# Patient Record
Sex: Female | Born: 1955 | Race: White | Hispanic: No | Smoking: Current every day smoker
Health system: Southern US, Community
[De-identification: ages and names within clinical notes are randomized; demographics above are authoritative.]

## PROBLEM LIST (undated history)

## (undated) DIAGNOSIS — I10 Essential (primary) hypertension: Secondary | ICD-10-CM

## (undated) DIAGNOSIS — B1920 Unspecified viral hepatitis C without hepatic coma: Secondary | ICD-10-CM

## (undated) DIAGNOSIS — K7460 Unspecified cirrhosis of liver: Secondary | ICD-10-CM

## (undated) DIAGNOSIS — D696 Thrombocytopenia, unspecified: Secondary | ICD-10-CM

## (undated) DIAGNOSIS — F419 Anxiety disorder, unspecified: Secondary | ICD-10-CM

## (undated) DIAGNOSIS — Z720 Tobacco use: Secondary | ICD-10-CM

## (undated) DIAGNOSIS — IMO0001 Reserved for inherently not codable concepts without codable children: Secondary | ICD-10-CM

## (undated) DIAGNOSIS — K922 Gastrointestinal hemorrhage, unspecified: Secondary | ICD-10-CM

## (undated) DIAGNOSIS — D649 Anemia, unspecified: Secondary | ICD-10-CM

## (undated) HISTORY — PX: HUMERUS FRACTURE SURGERY: SHX670

## (undated) HISTORY — PX: SHOULDER SURGERY: SHX246

---

## 2004-01-03 ENCOUNTER — Ambulatory Visit: Payer: Self-pay | Admitting: Orthopedic Surgery

## 2004-02-01 ENCOUNTER — Ambulatory Visit: Payer: Self-pay | Admitting: Orthopedic Surgery

## 2006-05-19 ENCOUNTER — Ambulatory Visit (HOSPITAL_COMMUNITY): Admission: RE | Admit: 2006-05-19 | Discharge: 2006-05-19 | Payer: Self-pay | Admitting: Family Medicine

## 2007-07-27 ENCOUNTER — Ambulatory Visit (HOSPITAL_COMMUNITY): Admission: RE | Admit: 2007-07-27 | Discharge: 2007-07-27 | Payer: Self-pay | Admitting: Internal Medicine

## 2009-01-07 ENCOUNTER — Inpatient Hospital Stay (HOSPITAL_COMMUNITY): Admission: EM | Admit: 2009-01-07 | Discharge: 2009-01-09 | Payer: Self-pay | Admitting: Emergency Medicine

## 2009-01-25 ENCOUNTER — Ambulatory Visit: Payer: Self-pay | Admitting: Gastroenterology

## 2009-02-22 ENCOUNTER — Ambulatory Visit: Payer: Self-pay | Admitting: Gastroenterology

## 2009-05-24 ENCOUNTER — Ambulatory Visit: Payer: Self-pay | Admitting: Gastroenterology

## 2009-08-27 ENCOUNTER — Emergency Department (HOSPITAL_COMMUNITY): Admission: EM | Admit: 2009-08-27 | Discharge: 2009-08-27 | Payer: Self-pay | Admitting: Emergency Medicine

## 2010-01-26 ENCOUNTER — Encounter: Payer: Self-pay | Admitting: Internal Medicine

## 2010-03-22 LAB — CBC
HCT: 30.3 % — ABNORMAL LOW (ref 36.0–46.0)
Hemoglobin: 10.5 g/dL — ABNORMAL LOW (ref 12.0–15.0)
MCH: 34.2 pg — ABNORMAL HIGH (ref 26.0–34.0)
MCV: 98.9 fL (ref 78.0–100.0)
Platelets: 32 10*3/uL — ABNORMAL LOW (ref 150–400)
RBC: 3.06 MIL/uL — ABNORMAL LOW (ref 3.87–5.11)
RDW: 14.8 % (ref 11.5–15.5)
WBC: 3.1 10*3/uL — ABNORMAL LOW (ref 4.0–10.5)

## 2010-03-22 LAB — COMPREHENSIVE METABOLIC PANEL
Alkaline Phosphatase: 57 U/L (ref 39–117)
BUN: 12 mg/dL (ref 6–23)
Chloride: 109 mEq/L (ref 96–112)
Creatinine, Ser: 0.6 mg/dL (ref 0.4–1.2)
GFR calc Af Amer: >60 mL/min (ref >60)
GFR calc non Af Amer: >60 mL/min (ref >60)
Glucose, Bld: 110 mg/dL — ABNORMAL HIGH (ref 70–99)
Potassium: 3.9 mEq/L (ref 3.5–5.1)
Sodium: 136 mEq/L (ref 135–145)
Total Protein: 7 g/dL (ref 6.0–8.3)

## 2010-03-22 LAB — URINE MICROSCOPIC-ADD ON

## 2010-03-22 LAB — DIFFERENTIAL
Basophils Absolute: 0 10*3/uL (ref 0.0–0.1)
Lymphs Abs: 1 10*3/uL (ref 0.7–4.0)
Monocytes Absolute: 0.3 10*3/uL (ref 0.1–1.0)
Neutro Abs: 1.8 10*3/uL (ref 1.7–7.7)
Neutrophils Relative %: 58 % (ref 43–77)

## 2010-03-22 LAB — RAPID URINE DRUG SCREEN, HOSP PERFORMED
Barbiturates: NOT DETECTED
Cocaine: NOT DETECTED
Opiates: NOT DETECTED

## 2010-03-22 LAB — URINALYSIS, ROUTINE W REFLEX MICROSCOPIC
Specific Gravity, Urine: <1.005 — ABNORMAL LOW (ref 1.005–1.030)
Urobilinogen, UA: 0.2 mg/dL (ref 0.0–1.0)
pH: 7 (ref 5.0–8.0)

## 2010-03-22 LAB — POCT CARDIAC MARKERS: CKMB, poc: <1 ng/mL — ABNORMAL LOW (ref 1.0–8.0)

## 2010-03-22 LAB — ACETAMINOPHEN LEVEL: Acetaminophen (Tylenol), Serum: <10 ug/mL — ABNORMAL LOW (ref 10–30)

## 2010-03-22 LAB — LACTIC ACID, PLASMA: Lactic Acid, Venous: 2.8 mmol/L — ABNORMAL HIGH (ref 0.5–2.2)

## 2010-03-24 LAB — DIFFERENTIAL
Basophils Absolute: 0.1 10*3/uL (ref 0.0–0.1)
Basophils Relative: 1 % (ref 0–1)
Eosinophils Relative: 4 % (ref 0–5)
Lymphs Abs: 3.5 10*3/uL (ref 0.7–4.0)
Monocytes Absolute: 0.6 10*3/uL (ref 0.1–1.0)
Monocytes Relative: 12 % (ref 3–12)
Neutro Abs: 1.1 10*3/uL — ABNORMAL LOW (ref 1.7–7.7)

## 2010-03-24 LAB — CARDIAC PANEL(CRET KIN+CKTOT+MB+TROPI)
CK, MB: 2.6 ng/mL (ref 0.3–4.0)
CK, MB: 4 ng/mL (ref 0.3–4.0)
Relative Index: INVALID (ref 0.0–2.5)
Relative Index: INVALID (ref 0.0–2.5)
Total CK: 91 U/L (ref 7–177)
Total CK: 98 U/L (ref 7–177)
Troponin I: 0.03 ng/mL (ref 0.00–0.06)

## 2010-03-24 LAB — CBC
HCT: 35.6 % — ABNORMAL LOW (ref 36.0–46.0)
HCT: 39.2 % (ref 36.0–46.0)
Hemoglobin: 12.5 g/dL (ref 12.0–15.0)
MCHC: 35 g/dL (ref 30.0–36.0)
MCV: 104.5 fL — ABNORMAL HIGH (ref 78.0–100.0)
Platelets: 57 10*3/uL — ABNORMAL LOW (ref 150–400)
RBC: 3.4 MIL/uL — ABNORMAL LOW (ref 3.87–5.11)
RBC: 3.75 MIL/uL — ABNORMAL LOW (ref 3.87–5.11)
RDW: 14.6 % (ref 11.5–15.5)

## 2010-03-24 LAB — MITOCHONDRIAL ANTIBODIES: Mitochondrial M2 Ab, IgG: NEGATIVE

## 2010-03-24 LAB — COMPREHENSIVE METABOLIC PANEL
ALT: 101 U/L — ABNORMAL HIGH (ref 0–35)
AST: 234 U/L — ABNORMAL HIGH (ref 0–37)
Albumin: 2.7 g/dL — ABNORMAL LOW (ref 3.5–5.2)
Alkaline Phosphatase: 76 U/L (ref 39–117)
Alkaline Phosphatase: 78 U/L (ref 39–117)
BUN: 6 mg/dL (ref 6–23)
BUN: 9 mg/dL (ref 6–23)
CO2: 23 mEq/L (ref 19–32)
CO2: 25 mEq/L (ref 19–32)
Chloride: 106 mEq/L (ref 96–112)
Chloride: 108 mEq/L (ref 96–112)
GFR calc non Af Amer: >60 mL/min (ref >60)
Glucose, Bld: 101 mg/dL — ABNORMAL HIGH (ref 70–99)
Glucose, Bld: 95 mg/dL (ref 70–99)
Potassium: 3.7 mEq/L (ref 3.5–5.1)
Potassium: 4 mEq/L (ref 3.5–5.1)
Sodium: 140 mEq/L (ref 135–145)
Total Bilirubin: 1.4 mg/dL — ABNORMAL HIGH (ref 0.3–1.2)
Total Bilirubin: 2.6 mg/dL — ABNORMAL HIGH (ref 0.3–1.2)
Total Protein: 6.6 g/dL (ref 6.0–8.3)
Total Protein: 7.3 g/dL (ref 6.0–8.3)

## 2010-03-24 LAB — ANTI-SMOOTH MUSCLE ANTIBODY, IGG: F-Actin IgG: <20 U (ref <20)

## 2010-03-24 LAB — POCT CARDIAC MARKERS
Myoglobin, poc: 153 ng/mL (ref 12–200)
Troponin i, poc: <0.05 ng/mL (ref 0.00–0.09)

## 2010-03-24 LAB — TRANSFERRIN: Transferrin: 173 mg/dL — ABNORMAL LOW (ref 212–360)

## 2010-03-24 LAB — FERRITIN: Ferritin: 812 ng/mL — ABNORMAL HIGH (ref 10–291)

## 2010-03-24 LAB — LIPASE, BLOOD
Lipase: 156 U/L — ABNORMAL HIGH (ref 11–59)
Lipase: 50 U/L (ref 11–59)
Lipase: 61 U/L — ABNORMAL HIGH (ref 11–59)

## 2010-03-24 LAB — D-DIMER, QUANTITATIVE: D-Dimer, Quant: 0.35 ug/mL-FEU (ref 0.00–0.48)

## 2010-03-24 LAB — ANA: Anti Nuclear Antibody(ANA): NEGATIVE

## 2010-03-24 LAB — T4, FREE: Free T4: 0.82 ng/dL (ref 0.80–1.80)

## 2010-03-24 LAB — IRON AND TIBC

## 2010-03-24 LAB — HEPATITIS PANEL, ACUTE: HCV Ab: REACTIVE — AB

## 2010-03-24 LAB — LIPID PANEL
Cholesterol: 125 mg/dL (ref 0–200)
LDL Cholesterol: 69 mg/dL (ref 0–99)

## 2010-06-04 ENCOUNTER — Emergency Department (HOSPITAL_COMMUNITY): Payer: BC Managed Care – PPO

## 2010-06-04 ENCOUNTER — Emergency Department (HOSPITAL_COMMUNITY)
Admission: EM | Admit: 2010-06-04 | Discharge: 2010-06-04 | Disposition: A | Discharge status: Home / Self Care | Payer: BC Managed Care – PPO | Attending: Emergency Medicine | Admitting: Emergency Medicine

## 2010-06-04 DIAGNOSIS — K7460 Unspecified cirrhosis of liver: Secondary | ICD-10-CM | POA: Insufficient documentation

## 2010-06-04 DIAGNOSIS — R42 Dizziness and giddiness: Secondary | ICD-10-CM | POA: Insufficient documentation

## 2010-06-04 DIAGNOSIS — R0602 Shortness of breath: Secondary | ICD-10-CM | POA: Insufficient documentation

## 2010-06-04 DIAGNOSIS — I10 Essential (primary) hypertension: Secondary | ICD-10-CM | POA: Insufficient documentation

## 2010-06-04 DIAGNOSIS — F17200 Nicotine dependence, unspecified, uncomplicated: Secondary | ICD-10-CM | POA: Insufficient documentation

## 2010-06-04 DIAGNOSIS — B1920 Unspecified viral hepatitis C without hepatic coma: Secondary | ICD-10-CM | POA: Insufficient documentation

## 2010-06-04 LAB — TROPONIN I: Troponin I: <0.3 ng/mL (ref <0.30)

## 2010-06-04 LAB — DIFFERENTIAL
Basophils Absolute: 0.1 10*3/uL (ref 0.0–0.1)
Basophils Relative: 2 % — ABNORMAL HIGH (ref 0–1)
Eosinophils Absolute: 0.2 10*3/uL (ref 0.0–0.7)
Monocytes Relative: 11 % (ref 3–12)
Neutro Abs: 1 10*3/uL — ABNORMAL LOW (ref 1.7–7.7)
Neutrophils Relative %: 35 % — ABNORMAL LOW (ref 43–77)

## 2010-06-04 LAB — AMMONIA: Ammonia: 26 umol/L (ref 11–60)

## 2010-06-04 LAB — CBC
Hemoglobin: 12.4 g/dL (ref 12.0–15.0)
Platelets: 72 10*3/uL — ABNORMAL LOW (ref 150–400)
RBC: 3.82 MIL/uL — ABNORMAL LOW (ref 3.87–5.11)
WBC: 2.9 10*3/uL — ABNORMAL LOW (ref 4.0–10.5)

## 2010-06-04 LAB — BASIC METABOLIC PANEL
BUN: 10 mg/dL (ref 6–23)
Chloride: 104 mEq/L (ref 96–112)
GFR calc Af Amer: >60 mL/min (ref >60)
GFR calc non Af Amer: >60 mL/min (ref >60)
Potassium: 3.7 mEq/L (ref 3.5–5.1)

## 2010-06-04 LAB — CK TOTAL AND CKMB (NOT AT ARMC): CK, MB: 1.7 ng/mL (ref 0.3–4.0)

## 2011-01-09 IMAGING — CT CT ABD-PELV W/ CM
1 of 3 series · 14 of 32 positions shown, 19 images · IV contrast (APPLIED)
Comparison: None

CLINICAL DATA: Nausea and elevated lipase.

CT ABDOMEN AND PELVIS WITH CONTRAST
TECHNIQUE: Multidetector CT imaging of the abdomen and pelvis was
performed following the standard protocol during bolus
administration of intravenous contrast.
Contrast: 80 ml of 6mnipaque-4YY

[Series 2: abd/pelv with 5.0 b31f st · axial · 0.75mm/px · z∈[+800,+1196]mm · 14 of 89 slices shown, 19 images]
[im 5/89  soft-tissue]
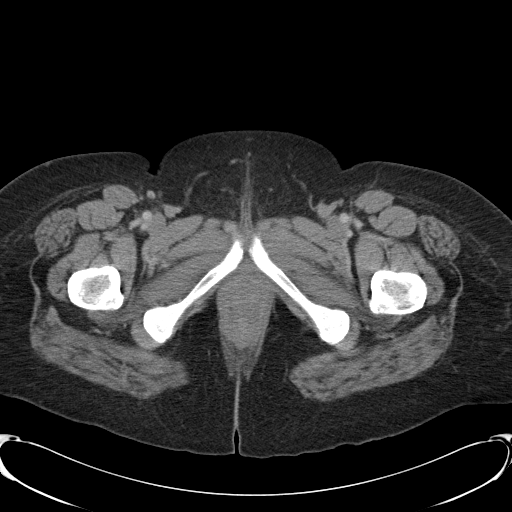
[im 5/89  bone]
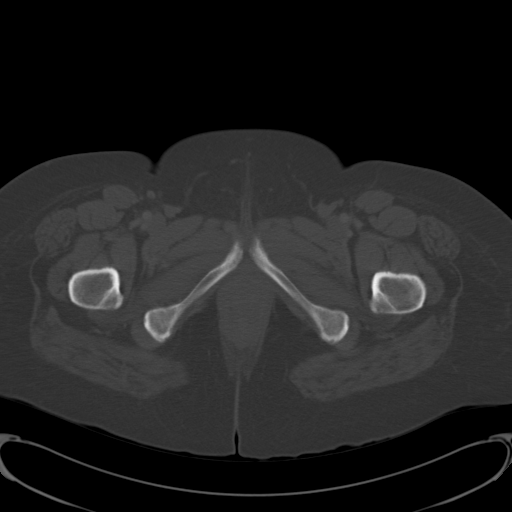
[im 14/89  soft-tissue]
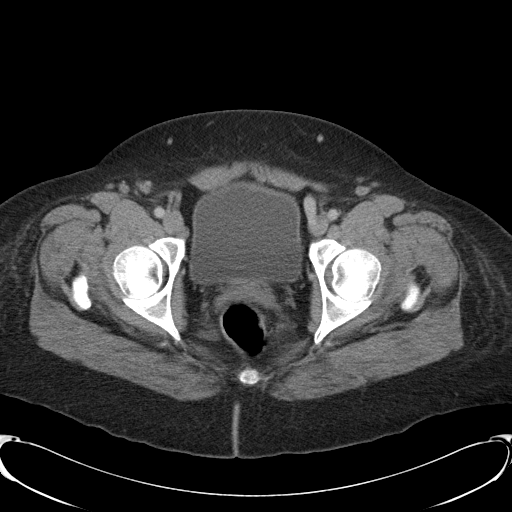
[im 18/89  soft-tissue]
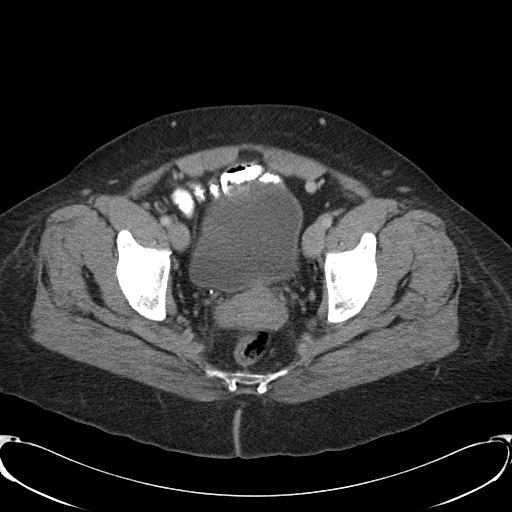
[im 27/89  soft-tissue]
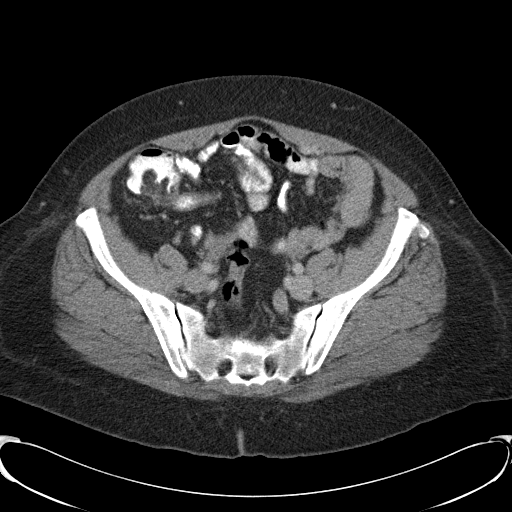
[im 31/89  soft-tissue]
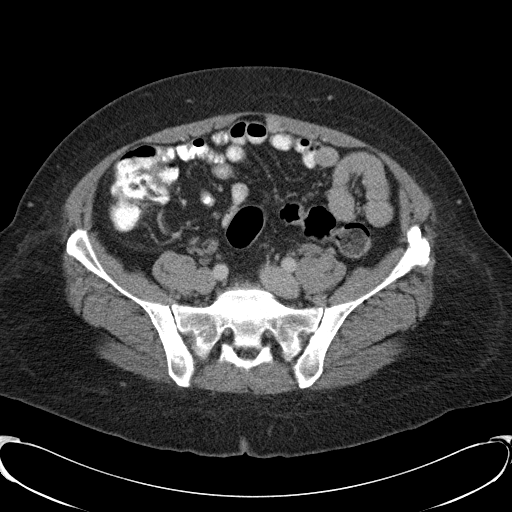
[im 40/89  soft-tissue]
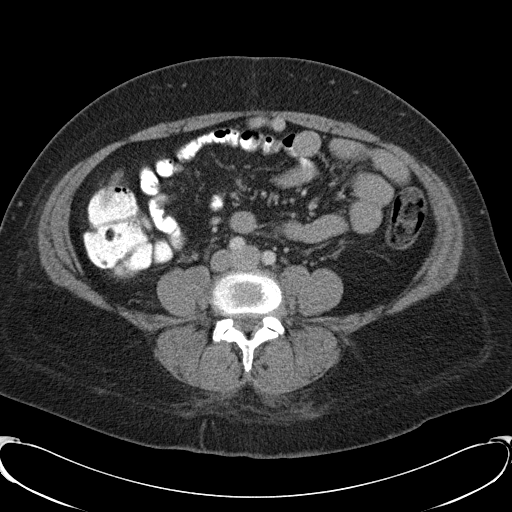
[im 45/89  soft-tissue]
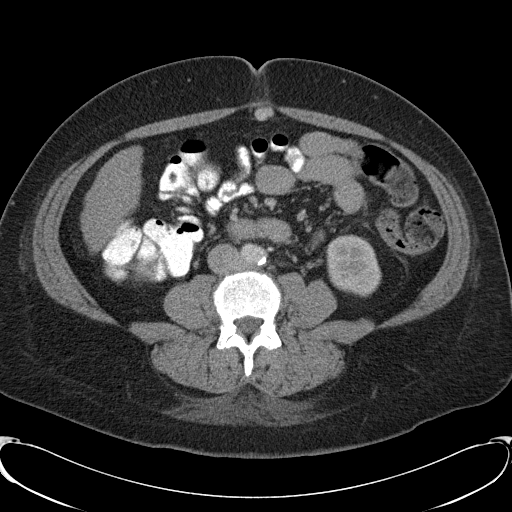
[im 49/89  soft-tissue]
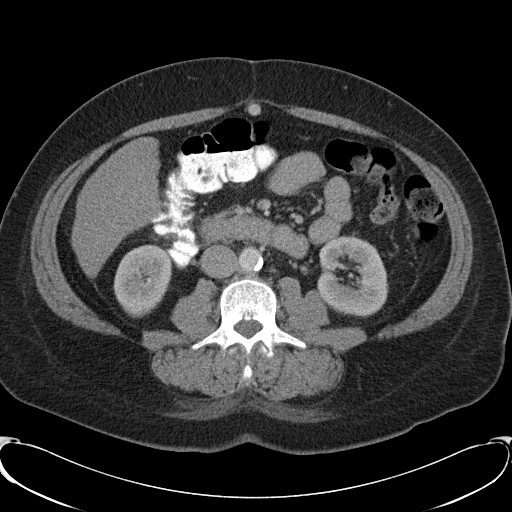
[im 58/89  soft-tissue]
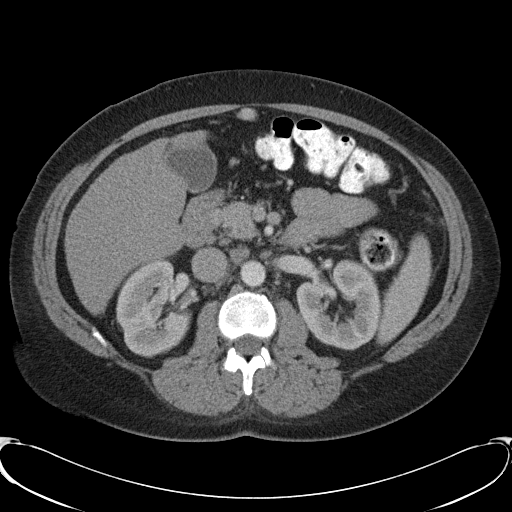
[im 58/89  bone]
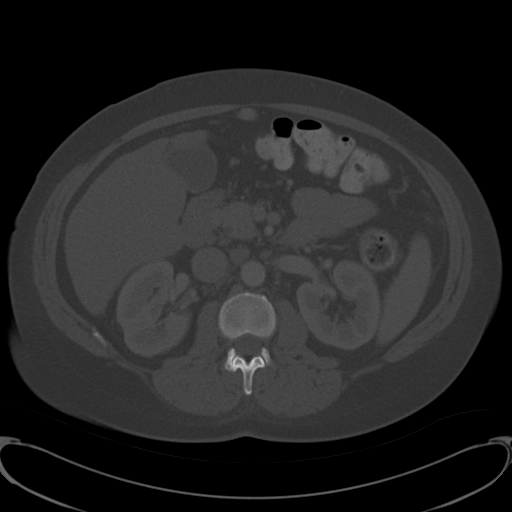
[im 62/89  soft-tissue]
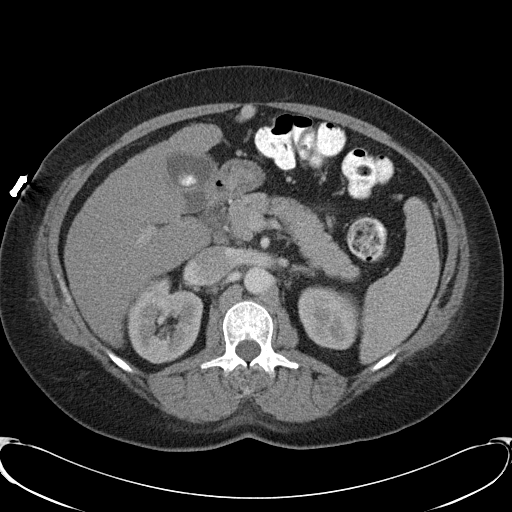
[im 71/89  soft-tissue]
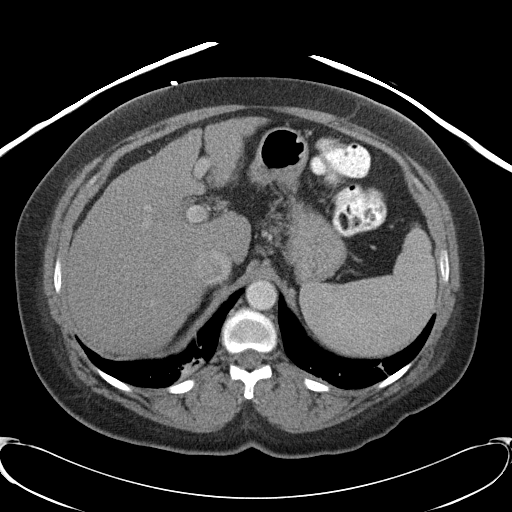
[im 71/89  lung]
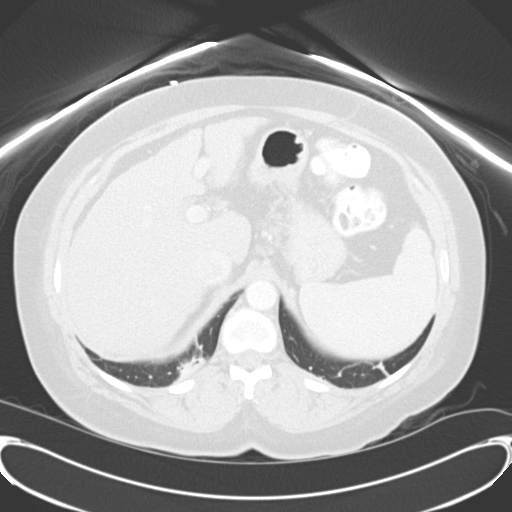
[im 75/89  soft-tissue]
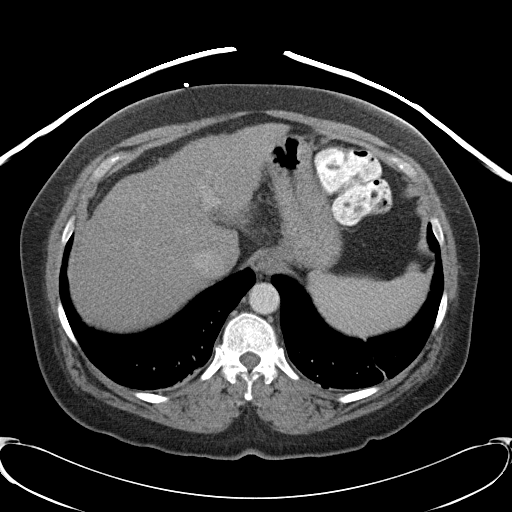
[im 75/89  lung]
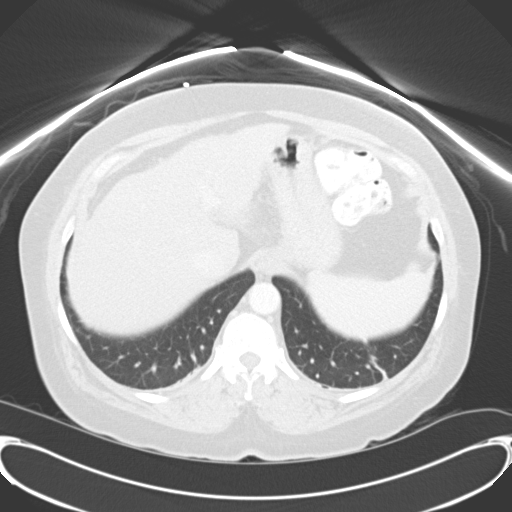
[im 80/89  lung]
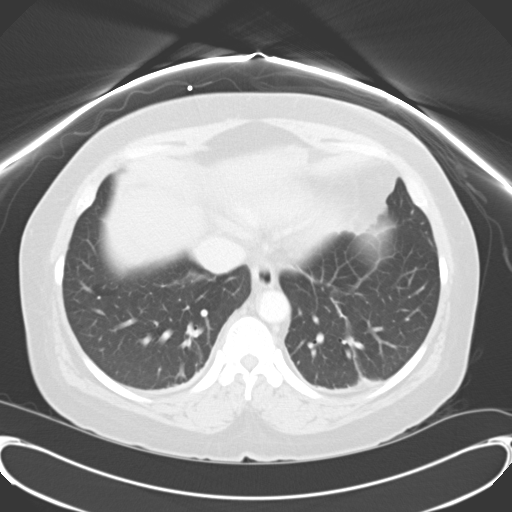
[im 84/89  soft-tissue]
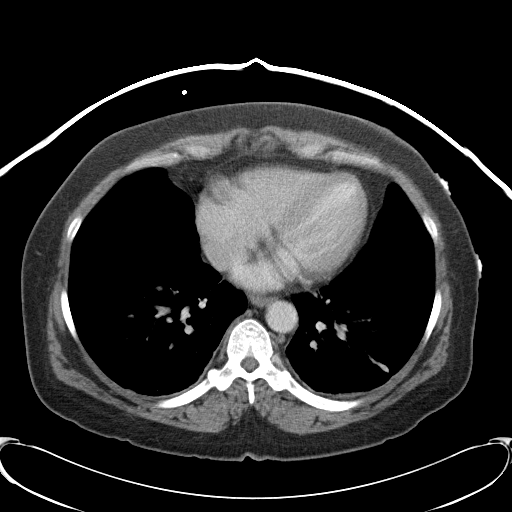
[im 84/89  lung]
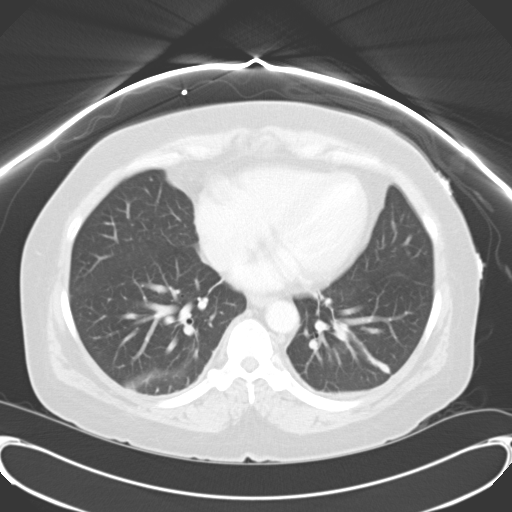

[14 of 32 positions shown; findings below may reference images not displayed]

FINDINGS: The liver has slightly year irregular and nodular
consistent with cirrhosis.  The patient has an large collateral
vein extending from the portal vein in the epigastric region
inferiorly along the left inferior epigastric artery to left
inserts into the left common femoral vein.  This indicates that the
patient has portal hypertension and has decompressed the portal
venous system through this large collateral vessel.

The the patient has no evidence of esophageal varices or varices
adjacent to the splenic artery and vein.

The spleen, pancreas, adrenal glands, and kidneys appear normal.

There is a 2.4 cm calcified stone in the nondistended gallbladder.
Bile ducts are not dilated.

No significant bony abnormality.

The uterus and ovaries appear normal.  Terminal ileum and appendix
are normal.
IMPRESSION: 1.  Cirrhosis.
2.  Portal hypertension.
3.  The portal venous system has spontaneously decompressed through
a varix extending from the porta hepatis inferiorly in the midline
deep to  the anterior abdominal wall into the left common femoral
vein.

## 2011-03-17 ENCOUNTER — Emergency Department (HOSPITAL_COMMUNITY)
Admission: EM | Admit: 2011-03-17 | Discharge: 2011-03-17 | Disposition: A | Discharge status: Home / Self Care | Payer: BC Managed Care – PPO | Attending: Emergency Medicine | Admitting: Emergency Medicine

## 2011-03-17 ENCOUNTER — Encounter (HOSPITAL_COMMUNITY): Payer: Self-pay | Admitting: *Deleted

## 2011-03-17 DIAGNOSIS — K829 Disease of gallbladder, unspecified: Secondary | ICD-10-CM | POA: Insufficient documentation

## 2011-03-17 DIAGNOSIS — R1011 Right upper quadrant pain: Secondary | ICD-10-CM | POA: Insufficient documentation

## 2011-03-17 DIAGNOSIS — F17200 Nicotine dependence, unspecified, uncomplicated: Secondary | ICD-10-CM | POA: Insufficient documentation

## 2011-03-17 DIAGNOSIS — K7460 Unspecified cirrhosis of liver: Secondary | ICD-10-CM | POA: Insufficient documentation

## 2011-03-17 DIAGNOSIS — I10 Essential (primary) hypertension: Secondary | ICD-10-CM | POA: Insufficient documentation

## 2011-03-17 HISTORY — DX: Unspecified cirrhosis of liver: K74.60

## 2011-03-17 HISTORY — DX: Essential (primary) hypertension: I10

## 2011-03-17 LAB — CBC
Hemoglobin: 14.1 g/dL (ref 12.0–15.0)
Platelets: 85 10*3/uL — ABNORMAL LOW (ref 150–400)
RBC: 4.17 MIL/uL (ref 3.87–5.11)
WBC: 4.9 10*3/uL (ref 4.0–10.5)

## 2011-03-17 LAB — COMPREHENSIVE METABOLIC PANEL
ALT: 47 U/L — ABNORMAL HIGH (ref 0–35)
Alkaline Phosphatase: 88 U/L (ref 39–117)
BUN: 10 mg/dL (ref 6–23)
CO2: 31 mEq/L (ref 19–32)
Chloride: 101 mEq/L (ref 96–112)
GFR calc Af Amer: >90 mL/min (ref >90)
GFR calc non Af Amer: >90 mL/min (ref >90)
Glucose, Bld: 81 mg/dL (ref 70–99)
Potassium: 3.7 mEq/L (ref 3.5–5.1)
Sodium: 139 mEq/L (ref 135–145)
Total Bilirubin: 1.1 mg/dL (ref 0.3–1.2)

## 2011-03-17 LAB — URINALYSIS, ROUTINE W REFLEX MICROSCOPIC
Bilirubin Urine: NEGATIVE
Ketones, ur: NEGATIVE mg/dL
Nitrite: NEGATIVE
Protein, ur: NEGATIVE mg/dL
Urobilinogen, UA: 0.2 mg/dL (ref 0.0–1.0)
pH: 6.5 (ref 5.0–8.0)

## 2011-03-17 LAB — DIFFERENTIAL
Lymphocytes Relative: 44 % (ref 12–46)
Lymphs Abs: 2.1 10*3/uL (ref 0.7–4.0)
Monocytes Relative: 10 % (ref 3–12)
Neutrophils Relative %: 42 % — ABNORMAL LOW (ref 43–77)

## 2011-03-17 LAB — URINE MICROSCOPIC-ADD ON

## 2011-03-17 MED ORDER — ONDANSETRON HCL 4 MG/2ML IJ SOLN
4.0000 mg | Freq: Once | INTRAMUSCULAR | Status: AC
  Administered 2011-03-17: 4 mg via INTRAVENOUS
  Filled 2011-03-17: qty 2

## 2011-03-17 MED ORDER — HYDROCODONE-ACETAMINOPHEN 5-325 MG PO TABS: 1.0000 | ORAL_TABLET | ORAL | Status: AC | PRN

## 2011-03-17 MED ORDER — ONDANSETRON HCL 4 MG PO TABS: 4.0000 mg | ORAL_TABLET | Freq: Four times a day (QID) | ORAL | Status: AC

## 2011-03-17 MED ORDER — HYDROMORPHONE HCL PF 1 MG/ML IJ SOLN
1.0000 mg | Freq: Once | INTRAMUSCULAR | Status: AC
  Administered 2011-03-17: 1 mg via INTRAVENOUS
  Filled 2011-03-17: qty 1

## 2011-03-17 MED ORDER — SODIUM CHLORIDE 0.9 % IV BOLUS (SEPSIS)
1000.0000 mL | Freq: Once | INTRAVENOUS | Status: AC
  Administered 2011-03-17: 1000 mL via INTRAVENOUS

## 2011-03-17 NOTE — ED Provider Notes (Signed)
History     CSN: [id number]  Arrival date & time 03/17/11  1428   First MD Initiated Contact with Patient 03/17/11 1734      Chief Complaint  Patient presents with  . Abdominal Pain    (Consider location/radiation/quality/duration/timing/severity/associated sxs/prior treatment) HPI Cynthia Mathews is a 56 y.o. female with a history of cirrhosis who presents to the Emergency Department complaining of RUQ pain associated with nausea, no vomiting. Denies fever, chills, diarrhea, chest pain, shortness of breath. She was seen in her primary care physician's office today and sent to the emergency room for evaluation. She has taken no medicines.  PCP Dr. Sherwood Gambler   Past Medical History  Diagnosis Date  . Cirrhosis   . Hypertension     Past Surgical History  Procedure Date  . Shoulder surgery     History reviewed. No pertinent family history.  History  Substance Use Topics  . Smoking status: Current Everyday Smoker  . Smokeless tobacco: Not on file  . Alcohol Use: No    OB History    Grav Para Term Preterm Abortions TAB SAB Ect Mult Living                  Review of Systems A 10 review of systems reviewed and are negative for acute change except as noted in the HPI. Allergies  Review of patient's allergies indicates no known allergies.  Home Medications  No current outpatient prescriptions on file.  BP 125/62  Pulse 83  Temp(Src) 98.1 F (36.7 C) (Oral)  Resp 16  Ht 5\' 1"  (1.549 m)  Wt 151 lb (68.493 kg)  BMI 28.53 kg/m2  SpO2 97%  Physical Exam Physical examination:  Nursing notes reviewed; Vital signs and O2 SAT reviewed;  Constitutional: Well developed, Well nourished, Well hydrated, In no acute distress; Head:  Normocephalic, atraumatic; Eyes: EOMI, PERRL, No scleral icterus; ENMT: Mouth and pharynx normal, Mucous membranes moist; Neck: Supple, Full range of motion, No lymphadenopathy; Cardiovascular: Regular rate and rhythm, No murmur, rub, or gallop;  Respiratory: Breath sounds clear & equal bilaterally, No rales, rhonchi, wheezes, or rub, Normal respiratory effort/excursion; Chest: Nontender, Movement normal; Abdomen: Soft, mild right upper quadrant tenderness without Murphy's sign, no rebound, no guarding, Nondistended, Normal bowel sounds; Genitourinary: No CVA tenderness; Extremities: Pulses normal, No tenderness, No edema, No calf edema or asymmetry.; Neuro: AA&Ox3, Major CN grossly intact.  No gross focal motor or sensory deficits in extremities.; Skin: Color normal, Warm, Dry  ED Course  Procedures (including critical care time) Results for orders placed during the hospital encounter of 03/17/11  CBC      Component Value Range   WBC 4.9  4.0 - 10.5 (K/uL)   RBC 4.17  3.87 - 5.11 (MIL/uL)   Hemoglobin 14.1  12.0 - 15.0 (g/dL)   HCT 40.9  81.1 - 91.4 (%)   MCV 95.4  78.0 - 100.0 (fL)   MCH 33.8  26.0 - 34.0 (pg)   MCHC 35.4  30.0 - 36.0 (g/dL)   RDW 78.2  95.6 - 21.3 (%)   Platelets 85 (*) 150 - 400 (K/uL)  DIFFERENTIAL      Component Value Range   Neutrophils Relative 42 (*) 43 - 77 (%)   Neutro Abs 2.1  1.7 - 7.7 (K/uL)   Lymphocytes Relative 44  12 - 46 (%)   Lymphs Abs 2.1  0.7 - 4.0 (K/uL)   Monocytes Relative 10  3 - 12 (%)   Monocytes Absolute  0.5  0.1 - 1.0 (K/uL)   Eosinophils Relative 4  0 - 5 (%)   Eosinophils Absolute 0.2  0.0 - 0.7 (K/uL)   Basophils Relative 1  0 - 1 (%)   Basophils Absolute 0.0  0.0 - 0.1 (K/uL)  COMPREHENSIVE METABOLIC PANEL      Component Value Range   Sodium 139  135 - 145 (mEq/L)   Potassium 3.7  3.5 - 5.1 (mEq/L)   Chloride 101  96 - 112 (mEq/L)   CO2 31  19 - 32 (mEq/L)   Glucose, Bld 81  70 - 99 (mg/dL)   BUN 10  6 - 23 (mg/dL)   Creatinine, Ser 1.61  0.50 - 1.10 (mg/dL)   Calcium 9.3  8.4 - 09.6 (mg/dL)   Total Protein 7.6  6.0 - 8.3 (g/dL)   Albumin 3.6  3.5 - 5.2 (g/dL)   AST 57 (*) 0 - 37 (U/L)   ALT 47 (*) 0 - 35 (U/L)   Alkaline Phosphatase 88  39 - 117 (U/L)   Total  Bilirubin 1.1  0.3 - 1.2 (mg/dL)   GFR calc non Af Amer >90  >90 (mL/min)   GFR calc Af Amer >90  >90 (mL/min)  URINALYSIS, ROUTINE W REFLEX MICROSCOPIC      Component Value Range   Color, Urine YELLOW  YELLOW    APPearance CLEAR  CLEAR    Specific Gravity, Urine <1.005 (*) 1.005 - 1.030    pH 6.5  5.0 - 8.0    Glucose, UA NEGATIVE  NEGATIVE (mg/dL)   Hgb urine dipstick NEGATIVE  NEGATIVE    Bilirubin Urine NEGATIVE  NEGATIVE    Ketones, ur NEGATIVE  NEGATIVE (mg/dL)   Protein, ur NEGATIVE  NEGATIVE (mg/dL)   Urobilinogen, UA 0.2  0.0 - 1.0 (mg/dL)   Nitrite NEGATIVE  NEGATIVE    Leukocytes, UA SMALL (*) NEGATIVE   LIPASE, BLOOD      Component Value Range   Lipase 55  11 - 59 (U/L)  URINE MICROSCOPIC-ADD ON      Component Value Range   Squamous Epithelial / LPF FEW (*) RARE    WBC, UA 3-6  <3 (WBC/hpf)   RBC / HPF 0-2  <3 (RBC/hpf)   Bacteria, UA FEW (*) RARE        MDM  Patient with h/o cirrhosis here with RUQ pain. Seen by PCP who felt she might need an [place] to r/o gall bladder disease.Pt stable in ED with no significant deterioration in condition.The patient appears reasonably screened and/or stabilized for discharge and I doubt any other medical condition or other [facility] requiring further screening, evaluation, or treatment in the ED at this time prior to discharge.  MDM Reviewed: nursing note and vitals Interpretation: labs           Nicoletta Dress. Colon Branch, MD 03/19/11 1810

## 2011-03-17 NOTE — Discharge Instructions (Signed)
Your blood work here tonight was normal.  Nothing to eat or drink after 11:30PM . You are scheduled for an ultrasound at 7:30 AM. Be in radiology at 7:15 AM. Once your ultrasound is done, you will come to the ER waiting room and the doctor on duty will review your results with you.

## 2011-03-17 NOTE — ED Notes (Signed)
Onset this am RUQ pain, nausea, no vomiting, Hx of cirrhosis

## 2011-03-18 ENCOUNTER — Ambulatory Visit (HOSPITAL_COMMUNITY)
Admit: 2011-03-18 | Discharge: 2011-03-18 | Disposition: A | Discharge status: Home / Self Care | Payer: BC Managed Care – PPO | Source: Ambulatory Visit | Attending: Emergency Medicine | Admitting: Emergency Medicine

## 2011-03-18 DIAGNOSIS — R112 Nausea with vomiting, unspecified: Secondary | ICD-10-CM | POA: Insufficient documentation

## 2011-03-18 DIAGNOSIS — R1011 Right upper quadrant pain: Secondary | ICD-10-CM | POA: Insufficient documentation

## 2011-03-18 DIAGNOSIS — K829 Disease of gallbladder, unspecified: Secondary | ICD-10-CM

## 2011-03-18 DIAGNOSIS — K7460 Unspecified cirrhosis of liver: Secondary | ICD-10-CM | POA: Insufficient documentation

## 2011-03-18 DIAGNOSIS — K8020 Calculus of gallbladder without cholecystitis without obstruction: Secondary | ICD-10-CM | POA: Insufficient documentation

## 2011-03-18 NOTE — ED Provider Notes (Signed)
Pt returned today for RUQ ultrasound. Large mobile gallstone without evidence of cholecystitis. Pt reports currently symptom free. Surgery referral provided.  Raeford Razor, MD 03/18/11 [phone]

## 2011-03-19 ENCOUNTER — Telehealth: Payer: Self-pay | Admitting: Gastroenterology

## 2011-03-19 NOTE — Telephone Encounter (Signed)
Ms Cynthia Mathews called to relate that she had been in the [facility] ER for RUQ pain and nausea on 03/17/11.  An ultrasound on 03/18/11 was done and I reviewed it.  There is no evidence of cholecystitis.  Given the lack of objective evidence of cholecystitis and her operative risk due to cirrhosis, I called and advised against a cholecystectomy.  She accepted my advice.  Ms Cynthia Mathews will see me in May 2013 at [facility].

## 2011-06-29 ENCOUNTER — Encounter (HOSPITAL_COMMUNITY): Payer: Self-pay | Admitting: *Deleted

## 2011-06-29 ENCOUNTER — Emergency Department (HOSPITAL_COMMUNITY)
Admission: EM | Admit: 2011-06-29 | Discharge: 2011-06-29 | Disposition: A | Discharge status: Home / Self Care | Payer: BC Managed Care – PPO | Attending: Emergency Medicine | Admitting: Emergency Medicine

## 2011-06-29 ENCOUNTER — Emergency Department (HOSPITAL_COMMUNITY): Payer: BC Managed Care – PPO

## 2011-06-29 DIAGNOSIS — W010XXA Fall on same level from slipping, tripping and stumbling without subsequent striking against object, initial encounter: Secondary | ICD-10-CM | POA: Insufficient documentation

## 2011-06-29 DIAGNOSIS — Z8781 Personal history of (healed) traumatic fracture: Secondary | ICD-10-CM | POA: Insufficient documentation

## 2011-06-29 DIAGNOSIS — Y92009 Unspecified place in unspecified non-institutional (private) residence as the place of occurrence of the external cause: Secondary | ICD-10-CM | POA: Insufficient documentation

## 2011-06-29 DIAGNOSIS — I10 Essential (primary) hypertension: Secondary | ICD-10-CM | POA: Insufficient documentation

## 2011-06-29 DIAGNOSIS — S4980XA Other specified injuries of shoulder and upper arm, unspecified arm, initial encounter: Secondary | ICD-10-CM | POA: Insufficient documentation

## 2011-06-29 DIAGNOSIS — S4992XA Unspecified injury of left shoulder and upper arm, initial encounter: Secondary | ICD-10-CM

## 2011-06-29 DIAGNOSIS — K7460 Unspecified cirrhosis of liver: Secondary | ICD-10-CM | POA: Insufficient documentation

## 2011-06-29 DIAGNOSIS — M25519 Pain in unspecified shoulder: Secondary | ICD-10-CM | POA: Insufficient documentation

## 2011-06-29 DIAGNOSIS — S46909A Unspecified injury of unspecified muscle, fascia and tendon at shoulder and upper arm level, unspecified arm, initial encounter: Secondary | ICD-10-CM | POA: Insufficient documentation

## 2011-06-29 DIAGNOSIS — Z885 Allergy status to narcotic agent status: Secondary | ICD-10-CM | POA: Insufficient documentation

## 2011-06-29 DIAGNOSIS — Z79899 Other long term (current) drug therapy: Secondary | ICD-10-CM | POA: Insufficient documentation

## 2011-06-29 MED ORDER — HYDROCODONE-ACETAMINOPHEN 5-325 MG PO TABS
2.0000 | ORAL_TABLET | Freq: Once | ORAL | Status: AC
  Administered 2011-06-29: 2 via ORAL
  Filled 2011-06-29: qty 2

## 2011-06-29 MED ORDER — HYDROCODONE-ACETAMINOPHEN 5-325 MG PO TABS: ORAL_TABLET | ORAL | Status: AC

## 2011-06-29 NOTE — ED Notes (Signed)
Pt transported to xray 

## 2011-06-29 NOTE — ED Notes (Signed)
Reports having fall today, landed on left shoulder and now having severe pain, hx of surgery to that shoulder.  +left radial.

## 2011-06-29 NOTE — Progress Notes (Signed)
Orthopedic Tech Progress Note Patient Details:  Cynthia Mathews Oct 22, 1955 [id number]  Ortho Devices Type of Ortho Device: Arm foam sling Ortho Device/Splint Location: left UE Ortho Device/Splint Interventions: Application   [name] T 06/29/2011, 1:07 PM

## 2011-06-29 NOTE — ED Provider Notes (Signed)
History  This chart was scribed for [name], MD by Bennett Scrape. This patient was seen in room TR06C/TR06C and the patient's care was started at 12:26PM.  CSN: [id number]  Arrival date & time 06/29/11  1206   First MD Initiated Contact with Patient 06/29/11 1226      Chief Complaint  Patient presents with  . Fall  . Shoulder Injury    The history is provided by the patient. No language interpreter was used.    SAMMANTHA Mathews is a 56 y.o. female who presents to the Emergency Department complaining of a slip and fall in which she landed on her left shoulder approximately one hour PTA. Pt states that she was walking down her front yard to get the newspaper when she slipped on the wet grass in her flip flops and landed on her left shoulder. She denies LOC or head trauma. She rates her pain a 15 out of 10 currently. The pain is worse with movement of the left shoulder. She denies taking OTC medications at home to improve symptoms. She denies having any other injuries currently. She denies fever, emesis and nausea as associated symptoms. She has a h/o left shoulder surgery, cirrhosis and HTN. She is a current everyday smoker but denies alcohol use.   Past Medical History  Diagnosis Date  . Cirrhosis   . Hypertension     Past Surgical History  Procedure Date  . Shoulder surgery- Left, done at [facility]     History reviewed. No pertinent family history.  History  Substance Use Topics  . Smoking status: Current Everyday Smoker  . Smokeless tobacco: Not on file  . Alcohol Use: No     Review of Systems  Constitutional: Negative for chills.  Musculoskeletal:       Left shoulder pain  Skin: Negative for wound.  All other systems reviewed and are negative.    Allergies  Codeine-feels nauseated   Home Medications   Current Outpatient Rx  Name Route Sig Dispense Refill  . ACETAMINOPHEN 500 MG PO TABS Oral Take 500 mg by mouth every 6 (six) hours as needed. For pain.    Marland Kitchen  CITALOPRAM HYDROBROMIDE 20 MG PO TABS Oral Take 20 mg by mouth at bedtime.    . FUROSEMIDE 40 MG PO TABS Oral Take 40 mg by mouth daily.    Marland Kitchen LACTULOSE 10 GM/15ML PO SOLN Oral Take 30 g by mouth 4 (four) times daily as needed. For constipation.    Marland Kitchen LORAZEPAM 0.5 MG PO TABS Oral Take 0.5 mg by mouth every 8 (eight) hours as needed. For anxiety.    . ADULT MULTIVITAMIN W/MINERALS CH Oral Take 1 tablet by mouth daily.    Marland Kitchen RIFAXIMIN 550 MG PO TABS Oral Take 550 mg by mouth 2 (two) times daily.    Marland Kitchen SPIRONOLACTONE 25 MG PO TABS Oral Take 25 mg by mouth daily.    Marland Kitchen VITAMIN B-1 PO Oral Take 1 tablet by mouth daily.      Triage Vitals: BP 128/71  Pulse 102  Temp 98.1 F (36.7 C) (Oral)  Resp 18  SpO2 99%  Physical Exam  Nursing note and vitals reviewed.  GEN: Well-developed, well-nourished female in no distress HEENT: Atraumatic, normocephalic. Oropharynx clear without erythema EYES: PERRLA BL, no scleral icterus. NECK: Trachea midline, no meningismus CV: regular rate and rhythm. No murmurs, rubs, or gallops PULM: No respiratory distress.  No crackles, wheezes, or rales. GI: soft, non-tender. No guarding, rebound, or tenderness. +  bowel sounds  GU: deferred Neuro: cranial nerves grossly 2-12 intact, no abnormalities of strength or sensation, A and O x 3 MSK: Surgical scar over the left shoulder that goes into the left upper arm, no tenderness to palpation over scapula, slight tenderness over the shoulder anteriorly, no deformity,  ROM is limited secondary to pain. Otherwise, Patient moves all 4 extremities symmetrically, no deformity, edema, or injury noted Skin: No rashes petechiae, purpura, or jaundice Psych: no abnormality of mood   ED Course  Procedures (including critical care time)  DIAGNOSTIC STUDIES: Oxygen Saturation is 99% on room air, normal by my interpretation.    COORDINATION OF CARE: 12:47PM-Discussed discharge plan which includes sling, ice pack and Vicodin with pt  and pt agreed to plan. Advised pt to follow up with Dr. Ophelia Charter if pain persists.  Labs Reviewed - No data to display Dg Shoulder Left  06/29/2011  *RADIOLOGY REPORT*  Clinical Data: Fall  LEFT SHOULDER - 2+ VIEW  Comparison: None.  Findings: Multiple plates and screws are  present transfixing a healed proximal humerus fracture with deformity.  No acute fracture.  No breakage or loosening of the hardware.  IMPRESSION: Status post ORIF left proximal humerus fracture with healed deformity.  No acute bony pathology.  Original Report Authenticated By: Donavan Burnet, M.D.     1. Injury of left shoulder       MDM  Patient was evaluated by myself. She did not have significant deformity. Plain film was performed and this was negative. Patient did continue to have tenderness to palpation but no dislocation. Following plain films patient was significantly reassured. Given her tenderness she was placed in a sling and given an ice back. She does not take anti-inflammatories secondary to her psoriasis and recommendations by her doctor. She was given a prescription for Vicodin and was feeling better prior to discharge. She was told she can follow up with orthopedics if her pain persists after swelling decreases from this very acute injury. Patient was discharged in good condition and comfortable with plan.  I personally performed the services described in this documentation, which was scribed in my presence. The recorded information has been reviewed and considered.          [name], MD 06/29/11 2035

## 2011-07-17 ENCOUNTER — Other Ambulatory Visit: Payer: Self-pay | Admitting: Orthopedic Surgery

## 2011-07-17 DIAGNOSIS — IMO0002 Reserved for concepts with insufficient information to code with codable children: Secondary | ICD-10-CM

## 2011-07-18 ENCOUNTER — Ambulatory Visit
Admission: RE | Admit: 2011-07-18 | Discharge: 2011-07-18 | Disposition: A | Discharge status: Home / Self Care | Payer: BC Managed Care – PPO | Source: Ambulatory Visit | Attending: Orthopedic Surgery | Admitting: Orthopedic Surgery

## 2011-07-18 DIAGNOSIS — IMO0002 Reserved for concepts with insufficient information to code with codable children: Secondary | ICD-10-CM

## 2011-08-06 ENCOUNTER — Other Ambulatory Visit: Payer: Self-pay | Admitting: Gastroenterology

## 2011-08-06 DIAGNOSIS — K7460 Unspecified cirrhosis of liver: Secondary | ICD-10-CM

## 2011-08-06 DIAGNOSIS — B182 Chronic viral hepatitis C: Secondary | ICD-10-CM

## 2011-08-08 ENCOUNTER — Ambulatory Visit (HOSPITAL_COMMUNITY)
Admission: RE | Admit: 2011-08-08 | Discharge: 2011-08-08 | Disposition: A | Discharge status: Home / Self Care | Payer: BC Managed Care – PPO | Source: Ambulatory Visit | Attending: Gastroenterology | Admitting: Gastroenterology

## 2011-08-08 DIAGNOSIS — K8020 Calculus of gallbladder without cholecystitis without obstruction: Secondary | ICD-10-CM | POA: Insufficient documentation

## 2011-08-08 DIAGNOSIS — K7460 Unspecified cirrhosis of liver: Secondary | ICD-10-CM

## 2011-08-08 DIAGNOSIS — B182 Chronic viral hepatitis C: Secondary | ICD-10-CM

## 2012-06-14 ENCOUNTER — Encounter (HOSPITAL_COMMUNITY): Payer: Self-pay | Admitting: *Deleted

## 2012-06-14 ENCOUNTER — Other Ambulatory Visit: Payer: Self-pay

## 2012-06-14 ENCOUNTER — Emergency Department (HOSPITAL_COMMUNITY)
Admission: EM | Admit: 2012-06-14 | Discharge: 2012-06-14 | Disposition: A | Discharge status: Home / Self Care | Payer: BC Managed Care – PPO | Attending: Emergency Medicine | Admitting: Emergency Medicine

## 2012-06-14 DIAGNOSIS — R5381 Other malaise: Secondary | ICD-10-CM | POA: Insufficient documentation

## 2012-06-14 DIAGNOSIS — R5383 Other fatigue: Secondary | ICD-10-CM | POA: Insufficient documentation

## 2012-06-14 DIAGNOSIS — R42 Dizziness and giddiness: Secondary | ICD-10-CM | POA: Insufficient documentation

## 2012-06-14 DIAGNOSIS — F17200 Nicotine dependence, unspecified, uncomplicated: Secondary | ICD-10-CM | POA: Insufficient documentation

## 2012-06-14 DIAGNOSIS — E7220 Disorder of urea cycle metabolism, unspecified: Secondary | ICD-10-CM | POA: Insufficient documentation

## 2012-06-14 DIAGNOSIS — E876 Hypokalemia: Secondary | ICD-10-CM | POA: Insufficient documentation

## 2012-06-14 DIAGNOSIS — I10 Essential (primary) hypertension: Secondary | ICD-10-CM | POA: Insufficient documentation

## 2012-06-14 DIAGNOSIS — Z79899 Other long term (current) drug therapy: Secondary | ICD-10-CM | POA: Insufficient documentation

## 2012-06-14 DIAGNOSIS — K7460 Unspecified cirrhosis of liver: Secondary | ICD-10-CM

## 2012-06-14 DIAGNOSIS — F29 Unspecified psychosis not due to a substance or known physiological condition: Secondary | ICD-10-CM | POA: Insufficient documentation

## 2012-06-14 LAB — COMPREHENSIVE METABOLIC PANEL
ALT: 57 U/L — ABNORMAL HIGH (ref 0–35)
BUN: 6 mg/dL (ref 6–23)
CO2: 29 mEq/L (ref 19–32)
Calcium: 8.8 mg/dL (ref 8.4–10.5)
GFR calc Af Amer: >90 mL/min (ref >90)
GFR calc non Af Amer: >90 mL/min (ref >90)
Glucose, Bld: 111 mg/dL — ABNORMAL HIGH (ref 70–99)
Total Protein: 8 g/dL (ref 6.0–8.3)

## 2012-06-14 LAB — URINALYSIS, ROUTINE W REFLEX MICROSCOPIC
Bilirubin Urine: NEGATIVE
Nitrite: NEGATIVE
Specific Gravity, Urine: <1.005 — ABNORMAL LOW (ref 1.005–1.030)
Urobilinogen, UA: 0.2 mg/dL (ref 0.0–1.0)
pH: 5.5 (ref 5.0–8.0)

## 2012-06-14 LAB — CBC WITH DIFFERENTIAL/PLATELET
Basophils Absolute: 0.1 10*3/uL (ref 0.0–0.1)
Basophils Relative: 1 % (ref 0–1)
HCT: 41.9 % (ref 36.0–46.0)
Hemoglobin: 15.5 g/dL — ABNORMAL HIGH (ref 12.0–15.0)
Lymphocytes Relative: 51 % — ABNORMAL HIGH (ref 12–46)
MCHC: 37 g/dL — ABNORMAL HIGH (ref 30.0–36.0)
Monocytes Absolute: 0.6 10*3/uL (ref 0.1–1.0)
Monocytes Relative: 9 % (ref 3–12)
Neutro Abs: 2.3 10*3/uL (ref 1.7–7.7)
Neutrophils Relative %: 35 % — ABNORMAL LOW (ref 43–77)
RDW: 13.8 % (ref 11.5–15.5)
WBC: 6.6 10*3/uL (ref 4.0–10.5)

## 2012-06-14 LAB — GLUCOSE, CAPILLARY: Glucose-Capillary: 114 mg/dL — ABNORMAL HIGH (ref 70–99)

## 2012-06-14 LAB — ETHANOL: Alcohol, Ethyl (B): 267 mg/dL — ABNORMAL HIGH (ref 0–11)

## 2012-06-14 LAB — AMMONIA: Ammonia: 38 umol/L (ref 11–60)

## 2012-06-14 LAB — RAPID URINE DRUG SCREEN, HOSP PERFORMED: Opiates: NOT DETECTED

## 2012-06-14 MED ORDER — POTASSIUM CHLORIDE CRYS ER 20 MEQ PO TBCR
40.0000 meq | EXTENDED_RELEASE_TABLET | Freq: Once | ORAL | Status: AC
  Administered 2012-06-14: 40 meq via ORAL
  Filled 2012-06-14: qty 2

## 2012-06-14 NOTE — ED Provider Notes (Signed)
History     CSN: [id number]  Arrival date & time 06/14/12  0545   First MD Initiated Contact with Patient 06/14/12 [phone]      Chief Complaint  Patient presents with  . Altered Mental Status     Patient is a 57 y.o. female presenting with altered mental status. The history is provided by the patient and a significant other.  Altered Mental Status Presenting symptoms: confusion and disorientation   Severity:  Mild Most recent episode:  Today Episode history:  Single Duration: unknown. Timing:  Constant Progression:  Worsening Context comment:  Cirrhosis Associated symptoms: light-headedness and weakness   Associated symptoms: no abdominal pain, no difficulty breathing, no fever, no headaches, no visual change and no vomiting   Patient reports she woke up this morning and felt "disoriented" and also reports "dizziness" She denies fall No HA No visual changes No cp/sob.  No abd pain.  No fever is reported   She feels generally weak but no focal weakness   Patient has h/o cirrhosis with hyperammonemia in the past Her and her significant other have recently separated, and she called him tonight and he reports that he could immediately tell she was altered. He reports this is similar to prior episodes of hyperammonemia  Past Medical History  Diagnosis Date  . Cirrhosis   . Hypertension     Past Surgical History  Procedure Laterality Date  . Shoulder surgery      History reviewed. No pertinent family history.  History  Substance Use Topics  . Smoking status: Current Every Day Smoker  . Smokeless tobacco: Not on file  . Alcohol Use: No    OB History   Grav Para Term Preterm Abortions TAB SAB Ect Mult Living                  Review of Systems  Constitutional: Negative for fever.  Eyes: Negative for visual disturbance.  Respiratory: Negative for shortness of breath.   Cardiovascular: Negative for chest pain.  Gastrointestinal: Negative for vomiting and abdominal  pain.  Neurological: Positive for weakness and light-headedness. Negative for headaches.  Psychiatric/Behavioral: Positive for confusion and altered mental status.  All other systems reviewed and are negative.    Allergies  Codeine  Home Medications   Current Outpatient Rx  Name  Route  Sig  Dispense  Refill  . acetaminophen (TYLENOL) 500 MG tablet   Oral   Take 500 mg by mouth every 6 (six) hours as needed. For pain.         . citalopram (CELEXA) 20 MG tablet   Oral   Take 20 mg by mouth at bedtime.         . furosemide (LASIX) 40 MG tablet   Oral   Take 40 mg by mouth daily.         Marland Kitchen HYDROcodone-acetaminophen (NORCO) 5-325 MG per tablet      Take 1-2 tabs by mouth every 6 hours when necessary pain.   20 tablet   0   . lactulose (CHRONULAC) 10 GM/15ML solution   Oral   Take 30 g by mouth 4 (four) times daily as needed. For constipation.         Marland Kitchen LORazepam (ATIVAN) 0.5 MG tablet   Oral   Take 0.5 mg by mouth every 8 (eight) hours as needed. For anxiety.         . Multiple Vitamin (MULTIVITAMIN WITH MINERALS) TABS   Oral   Take 1 tablet  by mouth daily.         . rifaximin (XIFAXAN) 550 MG TABS   Oral   Take 550 mg by mouth 2 (two) times daily.         Marland Kitchen spironolactone (ALDACTONE) 25 MG tablet   Oral   Take 25 mg by mouth daily.         . Thiamine HCl (VITAMIN B-1 PO)   Oral   Take 1 tablet by mouth daily.           BP 125/71  Pulse 88  Temp(Src) 98 F (36.7 C) (Oral)  Resp 18  Ht 5\' 1"  (1.549 m)  Wt 186 lb (84.369 kg)  BMI 35.16 kg/m2  SpO2 94%  Physical Exam CONSTITUTIONAL: Well developed/well nourished HEAD: Normocephalic/atraumatic EYES: EOMI/PERRL, no nystagmus ENMT: Mucous membranes moist NECK: supple no meningeal signs SPINE:entire spine nontender CV: S1/S2 noted, no murmurs/rubs/gallops noted LUNGS: Lungs are clear to auscultation bilaterally, no apparent distress ABDOMEN: soft, nontender, no rebound or  guarding GU:no cva tenderness NEURO:Awake/alert, facies symmetric, no arm or leg drift is noted Upon ambulation, her gait is unsteady but she is able to correct and walk on her own No past pointing She is able to answer all questions appropriately EXTREMITIES: pulses normal, full ROM SKIN: warm, color normal PSYCH: no abnormalities of mood noted   ED Course  Procedures  Labs Reviewed  COMPREHENSIVE METABOLIC PANEL  CBC WITH DIFFERENTIAL  URINALYSIS, ROUTINE W REFLEX MICROSCOPIC  AMMONIA  ETHANOL  URINE RAPID DRUG SCREEN (HOSP PERFORMED)   6:16 AM Pt presents with reported confusion.  She is awake/alert, but does appear easily distracted.  She answers questions appropriately, though her significant other reports she kept arguing about today's date on the way to hospital.   She appears mildly unsteady on her feet but does not appear to have any signs of acute CVA.  She denies missed doses of her lactulose  Will initiate lab evaluation for confusion.  It is reported this is very similar to prior episodes of hyperammonemia, will start with labs.  Defer any neuro imaging for now 7:13 AM Pt stable, no new complaints Ammonia level appropriate, however ETOH is elevated Pt denies any ETOH use Her symptoms could be explained by alcohol use I doubt acute CVA or other acute neurologic/metabolic process.  She is well appearing and stable for d/c home Do not feel she is in acute delirium  MDM  Nursing notes including past medical history and social history reviewed and considered in documentation Previous records reviewed and considered - h/o cirrhosis        Date: 06/14/2012  Rate: 90  Rhythm: normal sinus rhythm  QRS Axis: normal  Intervals: QT prolonged  ST/T Wave abnormalities: nonspecific ST changes  Conduction Disutrbances:none  Narrative Interpretation:   Old EKG Reviewed: qt is more prolonged on today's ekg but less than 500    Joya Gaskins, MD 06/14/12 0715

## 2012-06-14 NOTE — ED Notes (Signed)
Ambulated to bathroom without assistance, and does not want help to ambulate, gait slightly unsteady at times, and obtained urine specimen.  Friend who is with her states when she called him - she did not sound normal - and told him she did not feel right.  He states she is acting the way she did the last time when her ammonia level was too high.

## 2012-06-14 NOTE — ED Notes (Signed)
Pt reports she woke up this morning and was very disoriented.  States this has happened previously due to elevated ammonia levels.  States last time was about 1 year ago.

## 2012-07-08 ENCOUNTER — Other Ambulatory Visit: Payer: Self-pay | Admitting: Gastroenterology

## 2013-04-20 ENCOUNTER — Other Ambulatory Visit (HOSPITAL_COMMUNITY): Payer: Self-pay | Admitting: Physician Assistant

## 2013-04-20 DIAGNOSIS — Z139 Encounter for screening, unspecified: Secondary | ICD-10-CM

## 2013-04-27 ENCOUNTER — Ambulatory Visit (HOSPITAL_COMMUNITY)
Admission: RE | Admit: 2013-04-27 | Discharge: 2013-04-27 | Disposition: A | Discharge status: Home / Self Care | Payer: BC Managed Care – PPO | Source: Ambulatory Visit | Attending: Physician Assistant | Admitting: Physician Assistant

## 2013-04-27 DIAGNOSIS — Z139 Encounter for screening, unspecified: Secondary | ICD-10-CM | POA: Insufficient documentation

## 2013-11-16 ENCOUNTER — Inpatient Hospital Stay (HOSPITAL_COMMUNITY)
Admission: EM | Admit: 2013-11-16 | Discharge: 2013-11-23 | DRG: 392 | Disposition: A | Discharge status: Home / Self Care | Payer: BC Managed Care – PPO | Attending: Internal Medicine | Admitting: Internal Medicine

## 2013-11-16 ENCOUNTER — Encounter (HOSPITAL_COMMUNITY): Payer: Self-pay

## 2013-11-16 ENCOUNTER — Emergency Department (HOSPITAL_COMMUNITY): Payer: Self-pay

## 2013-11-16 DIAGNOSIS — Z23 Encounter for immunization: Secondary | ICD-10-CM

## 2013-11-16 DIAGNOSIS — E869 Volume depletion, unspecified: Secondary | ICD-10-CM | POA: Diagnosis present

## 2013-11-16 DIAGNOSIS — K766 Portal hypertension: Secondary | ICD-10-CM | POA: Diagnosis present

## 2013-11-16 DIAGNOSIS — D696 Thrombocytopenia, unspecified: Secondary | ICD-10-CM | POA: Diagnosis present

## 2013-11-16 DIAGNOSIS — B1920 Unspecified viral hepatitis C without hepatic coma: Secondary | ICD-10-CM | POA: Diagnosis present

## 2013-11-16 DIAGNOSIS — K2970 Gastritis, unspecified, without bleeding: Secondary | ICD-10-CM | POA: Diagnosis present

## 2013-11-16 DIAGNOSIS — I4891 Unspecified atrial fibrillation: Secondary | ICD-10-CM | POA: Diagnosis present

## 2013-11-16 DIAGNOSIS — D689 Coagulation defect, unspecified: Secondary | ICD-10-CM | POA: Diagnosis present

## 2013-11-16 DIAGNOSIS — I10 Essential (primary) hypertension: Secondary | ICD-10-CM | POA: Diagnosis present

## 2013-11-16 DIAGNOSIS — D709 Neutropenia, unspecified: Secondary | ICD-10-CM | POA: Diagnosis present

## 2013-11-16 DIAGNOSIS — K5900 Constipation, unspecified: Secondary | ICD-10-CM | POA: Diagnosis present

## 2013-11-16 DIAGNOSIS — F419 Anxiety disorder, unspecified: Secondary | ICD-10-CM | POA: Diagnosis present

## 2013-11-16 DIAGNOSIS — D62 Acute posthemorrhagic anemia: Secondary | ICD-10-CM | POA: Diagnosis present

## 2013-11-16 DIAGNOSIS — R0602 Shortness of breath: Secondary | ICD-10-CM

## 2013-11-16 DIAGNOSIS — B182 Chronic viral hepatitis C: Secondary | ICD-10-CM | POA: Diagnosis present

## 2013-11-16 DIAGNOSIS — K7460 Unspecified cirrhosis of liver: Secondary | ICD-10-CM | POA: Diagnosis present

## 2013-11-16 DIAGNOSIS — Z720 Tobacco use: Secondary | ICD-10-CM | POA: Diagnosis present

## 2013-11-16 DIAGNOSIS — Z79899 Other long term (current) drug therapy: Secondary | ICD-10-CM

## 2013-11-16 DIAGNOSIS — R000 Tachycardia, unspecified: Secondary | ICD-10-CM | POA: Diagnosis present

## 2013-11-16 DIAGNOSIS — K3189 Other diseases of stomach and duodenum: Principal | ICD-10-CM | POA: Diagnosis present

## 2013-11-16 DIAGNOSIS — E876 Hypokalemia: Secondary | ICD-10-CM | POA: Diagnosis not present

## 2013-11-16 DIAGNOSIS — Z885 Allergy status to narcotic agent status: Secondary | ICD-10-CM

## 2013-11-16 DIAGNOSIS — K920 Hematemesis: Secondary | ICD-10-CM | POA: Diagnosis present

## 2013-11-16 DIAGNOSIS — F17210 Nicotine dependence, cigarettes, uncomplicated: Secondary | ICD-10-CM | POA: Diagnosis present

## 2013-11-16 DIAGNOSIS — D6959 Other secondary thrombocytopenia: Secondary | ICD-10-CM | POA: Diagnosis present

## 2013-11-16 DIAGNOSIS — K922 Gastrointestinal hemorrhage, unspecified: Secondary | ICD-10-CM | POA: Diagnosis present

## 2013-11-16 HISTORY — DX: Anxiety disorder, unspecified: F41.9

## 2013-11-16 HISTORY — DX: Unspecified viral hepatitis C without hepatic coma: B19.20

## 2013-11-16 HISTORY — DX: Tobacco use: Z72.0

## 2013-11-16 LAB — BASIC METABOLIC PANEL
Anion gap: 20 — ABNORMAL HIGH (ref 5–15)
BUN: 5 mg/dL — AB (ref 6–23)
CALCIUM: 8.2 mg/dL — AB (ref 8.4–10.5)
CO2: 18 mEq/L — ABNORMAL LOW (ref 19–32)
Chloride: 101 mEq/L (ref 96–112)
Creatinine, Ser: 0.48 mg/dL — ABNORMAL LOW (ref 0.50–1.10)
Glucose, Bld: 96 mg/dL (ref 70–99)
POTASSIUM: 3.7 meq/L (ref 3.7–5.3)
SODIUM: 139 meq/L (ref 137–147)

## 2013-11-16 LAB — AMMONIA: Ammonia: 45 umol/L (ref 11–60)

## 2013-11-16 LAB — HEPATIC FUNCTION PANEL
ALT: 44 U/L — ABNORMAL HIGH (ref 0–35)
AST: 157 U/L — ABNORMAL HIGH (ref 0–37)
Albumin: 2.5 g/dL — ABNORMAL LOW (ref 3.5–5.2)
Alkaline Phosphatase: 105 U/L (ref 39–117)
BILIRUBIN DIRECT: 1.6 mg/dL — AB (ref 0.0–0.3)
BILIRUBIN INDIRECT: 2.3 mg/dL — AB (ref 0.3–0.9)
Total Bilirubin: 3.9 mg/dL — ABNORMAL HIGH (ref 0.3–1.2)
Total Protein: 8 g/dL (ref 6.0–8.3)

## 2013-11-16 LAB — CBC WITH DIFFERENTIAL/PLATELET
BASOS PCT: 1 % (ref 0–1)
Basophils Absolute: 0 10*3/uL (ref 0.0–0.1)
EOS ABS: 0 10*3/uL (ref 0.0–0.7)
Eosinophils Relative: 0 % (ref 0–5)
HCT: 35.3 % — ABNORMAL LOW (ref 36.0–46.0)
Hemoglobin: 12.6 g/dL (ref 12.0–15.0)
Lymphocytes Relative: 31 % (ref 12–46)
Lymphs Abs: 0.9 10*3/uL (ref 0.7–4.0)
MCH: 37.1 pg — AB (ref 26.0–34.0)
MCHC: 35.7 g/dL (ref 30.0–36.0)
MCV: 103.8 fL — ABNORMAL HIGH (ref 78.0–100.0)
Monocytes Absolute: 0.3 10*3/uL (ref 0.1–1.0)
Monocytes Relative: 10 % (ref 3–12)
NEUTROS PCT: 57 % (ref 43–77)
Neutro Abs: 1.6 10*3/uL — ABNORMAL LOW (ref 1.7–7.7)
PLATELETS: 43 10*3/uL — AB (ref 150–400)
RBC: 3.4 MIL/uL — ABNORMAL LOW (ref 3.87–5.11)
RDW: 15.2 % (ref 11.5–15.5)
WBC: 2.8 10*3/uL — ABNORMAL LOW (ref 4.0–10.5)

## 2013-11-16 LAB — ETHANOL: Alcohol, Ethyl (B): <11 mg/dL (ref 0–11)

## 2013-11-16 LAB — TYPE AND SCREEN
ABO/RH(D): A POS
ANTIBODY SCREEN: NEGATIVE

## 2013-11-16 LAB — PROTIME-INR
INR: 1.71 — AB (ref 0.00–1.49)
Prothrombin Time: 20.2 seconds — ABNORMAL HIGH (ref 11.6–15.2)

## 2013-11-16 MED ORDER — SODIUM CHLORIDE 0.9 % IV BOLUS (SEPSIS)
500.0000 mL | Freq: Once | INTRAVENOUS | Status: AC
  Administered 2013-11-16: 500 mL via INTRAVENOUS

## 2013-11-16 MED ORDER — SPIRONOLACTONE 25 MG PO TABS
25.0000 mg | ORAL_TABLET | Freq: Every day | ORAL | Status: DC
  Administered 2013-11-17: 25 mg via ORAL
  Filled 2013-11-16: qty 1

## 2013-11-16 MED ORDER — CITALOPRAM HYDROBROMIDE 20 MG PO TABS: 20.0000 mg | ORAL_TABLET | Freq: Every day | ORAL | Status: DC

## 2013-11-16 MED ORDER — OCTREOTIDE ACETATE 500 MCG/ML IJ SOLN
INTRAMUSCULAR | Status: AC
  Filled 2013-11-16: qty 1

## 2013-11-16 MED ORDER — AMLODIPINE BESYLATE 5 MG PO TABS
10.0000 mg | ORAL_TABLET | Freq: Every day | ORAL | Status: DC
  Administered 2013-11-17: 10 mg via ORAL
  Filled 2013-11-16: qty 2

## 2013-11-16 MED ORDER — NICOTINE 14 MG/24HR TD PT24
14.0000 mg | MEDICATED_PATCH | Freq: Every day | TRANSDERMAL | Status: DC
  Filled 2013-11-16 (×7): qty 1

## 2013-11-16 MED ORDER — LORAZEPAM 2 MG/ML IJ SOLN
0.5000 mg | Freq: Three times a day (TID) | INTRAMUSCULAR | Status: DC | PRN
  Administered 2013-11-16 – 2013-11-17 (×2): 1 mg via INTRAVENOUS
  Filled 2013-11-16 (×2): qty 1

## 2013-11-16 MED ORDER — LACTULOSE 10 GM/15ML PO SOLN
30.0000 g | Freq: Two times a day (BID) | ORAL | Status: DC
  Administered 2013-11-17: 30 g via ORAL
  Filled 2013-11-16: qty 60

## 2013-11-16 MED ORDER — LEVOFLOXACIN IN D5W 500 MG/100ML IV SOLN
500.0000 mg | Freq: Once | INTRAVENOUS | Status: AC
  Administered 2013-11-16: 500 mg via INTRAVENOUS
  Filled 2013-11-16: qty 100

## 2013-11-16 MED ORDER — ONDANSETRON HCL 4 MG/2ML IJ SOLN
INTRAMUSCULAR | Status: AC
  Filled 2013-11-16: qty 2

## 2013-11-16 MED ORDER — ONDANSETRON HCL 4 MG/2ML IJ SOLN
4.0000 mg | Freq: Once | INTRAMUSCULAR | Status: AC
  Administered 2013-11-16: 4 mg via INTRAVENOUS

## 2013-11-16 MED ORDER — CETYLPYRIDINIUM CHLORIDE 0.05 % MT LIQD
7.0000 mL | Freq: Two times a day (BID) | OROMUCOSAL | Status: DC
  Administered 2013-11-16 – 2013-11-22 (×13): 7 mL via OROMUCOSAL

## 2013-11-16 MED ORDER — INFLUENZA VAC SPLIT QUAD 0.5 ML IM SUSY
0.5000 mL | PREFILLED_SYRINGE | INTRAMUSCULAR | Status: AC
Start: 2013-11-17 — End: 2013-11-17
  Administered 2013-11-17: 0.5 mL via INTRAMUSCULAR
  Filled 2013-11-16: qty 0.5

## 2013-11-16 MED ORDER — LABETALOL HCL 100 MG PO TABS
50.0000 mg | ORAL_TABLET | Freq: Two times a day (BID) | ORAL | Status: DC
  Administered 2013-11-16: 50 mg via ORAL
  Filled 2013-11-16 (×4): qty 0.5

## 2013-11-16 MED ORDER — PANTOPRAZOLE SODIUM 40 MG IV SOLR
40.0000 mg | Freq: Once | INTRAVENOUS | Status: AC
  Administered 2013-11-16: 40 mg via INTRAVENOUS
  Filled 2013-11-16: qty 40

## 2013-11-16 MED ORDER — SODIUM CHLORIDE 0.45 % IV SOLN
INTRAVENOUS | Status: DC
  Administered 2013-11-16: 1000 mL via INTRAVENOUS

## 2013-11-16 MED ORDER — FUROSEMIDE 10 MG/ML IJ SOLN
20.0000 mg | Freq: Every day | INTRAMUSCULAR | Status: DC
  Administered 2013-11-17: 20 mg via INTRAVENOUS
  Filled 2013-11-16: qty 2

## 2013-11-16 MED ORDER — OCTREOTIDE ACETATE 50 MCG/ML IJ SOLN
25.0000 ug | Freq: Once | INTRAMUSCULAR | Status: AC
  Administered 2013-11-16: 25 ug via INTRAVENOUS
  Filled 2013-11-16: qty 0.5

## 2013-11-16 MED ORDER — OCTREOTIDE ACETATE 100 MCG/ML IJ SOLN
INTRAMUSCULAR | Status: AC
  Filled 2013-11-16: qty 1

## 2013-11-16 MED ORDER — SODIUM CHLORIDE 0.9 % IV SOLN
25.0000 ug/h | INTRAVENOUS | Status: DC
  Administered 2013-11-16 – 2013-11-18 (×2): 25 ug/h via INTRAVENOUS
  Filled 2013-11-16 (×7): qty 1

## 2013-11-16 MED ORDER — SODIUM CHLORIDE 0.9 % IV BOLUS (SEPSIS)
1000.0000 mL | Freq: Once | INTRAVENOUS | Status: AC
  Administered 2013-11-16: 1000 mL via INTRAVENOUS

## 2013-11-16 MED ORDER — ONDANSETRON HCL 4 MG/2ML IJ SOLN
4.0000 mg | Freq: Once | INTRAMUSCULAR | Status: AC
  Administered 2013-11-16: 4 mg via INTRAVENOUS
  Filled 2013-11-16: qty 2

## 2013-11-16 MED ORDER — RIFAXIMIN 550 MG PO TABS
550.0000 mg | ORAL_TABLET | Freq: Two times a day (BID) | ORAL | Status: DC
  Administered 2013-11-17: 550 mg via ORAL
  Filled 2013-11-16: qty 1

## 2013-11-16 NOTE — ED Provider Notes (Signed)
CSN: [id number]     Arrival date & time 11/16/13  1820 History   This chart was scribe for Benny LennertJoseph L Mylo Choi, MD by Angelene GiovanniEmmanuella Mensah, ED Scribe. The patient was seen in room APA06/APA06 and the patient's care was started at 6:56 PM.    Chief Complaint  Patient presents with  . Shortness of Breath   Patient is a 58 y.o. female presenting with shortness of breath. The history is provided by the patient (Pt c/o SOB). No language interpreter was used.  Shortness of Breath Duration:  1 day Timing:  Intermittent Chronicity:  Recurrent Associated symptoms: vomiting   Associated symptoms: no abdominal pain, no chest pain, no cough, no headaches and no rash    HPI Comments: Cynthia Mathews is a 58 y.o. female with a hx of Hepatitis C and cirrhosis who presents to the Emergency Department complaining of SOB with nausea and hematemesis onset yesterday. She reports associated 6 episodes of hematemesis since this morning and 1 episode last night. She denies black stool or pain. She adds that the episodes today have been worse because of the amount of episodes in such a short time. She reports that it has been occurring several times a month for about 6 months. The latest episode was a week ago. She states that [facility] because she has had these emesis episodes before but no concluding results. She reports taking an extra lactulose this morning due to being confused last night which has seen resolved. She states that she is not on the national list for a liver transplant.   PCP: Dr. Sherwood GamblerFusco  [facility]  Past Medical History  Diagnosis Date  . Cirrhosis   . Hypertension   . Anxiety    Past Surgical History  Procedure Laterality Date  . Shoulder surgery    . Humerus fracture surgery     No family history on file. History  Substance Use Topics  . Smoking status: Current Every Day Smoker  . Smokeless tobacco: Not on file  . Alcohol Use: No   OB History     No data available     Review of Systems  Constitutional: Negative for appetite change and fatigue.  HENT: Negative for congestion, ear discharge and sinus pressure.   Eyes: Negative for discharge.  Respiratory: Positive for shortness of breath. Negative for cough.   Cardiovascular: Negative for chest pain.  Gastrointestinal: Positive for nausea and vomiting. Negative for abdominal pain, diarrhea and blood in stool.  Genitourinary: Negative for frequency and hematuria.  Musculoskeletal: Negative for back pain.  Skin: Negative for rash.  Neurological: Negative for seizures and headaches.  Psychiatric/Behavioral: Negative for hallucinations.      Allergies  Codeine  Home Medications   Prior to Admission medications   Medication Sig Start Date End Date Taking? Authorizing Provider  citalopram (CELEXA) 20 MG tablet Take 20 mg by mouth at bedtime.    Historical Provider, MD  furosemide (LASIX) 40 MG tablet Take 40 mg by mouth daily.    Historical Provider, MD  HYDROcodone-acetaminophen (NORCO) 5-325 MG per tablet Take 1-2 tabs by mouth every 6 hours when necessary pain. 06/29/11   [name], MD  lactulose (CHRONULAC) 10 GM/15ML solution Take 30 g by mouth 4 (four) times daily as needed. For constipation.    Historical Provider, MD  LORazepam (ATIVAN) 0.5 MG tablet Take 0.5 mg by mouth every 8 (eight) hours as needed. For anxiety.    Historical  Provider, MD  Multiple Vitamin (MULTIVITAMIN WITH MINERALS) TABS Take 1 tablet by mouth daily.    Historical Provider, MD  rifaximin (XIFAXAN) 550 MG TABS Take 550 mg by mouth 2 (two) times daily.    Historical Provider, MD  spironolactone (ALDACTONE) 25 MG tablet Take 25 mg by mouth daily.    Historical Provider, MD  Thiamine HCl (VITAMIN B-1 PO) Take 1 tablet by mouth daily.    Historical Provider, MD   BP 150/71 mmHg  Pulse 116  Temp(Src) 98.7 F (37.1 C) (Oral)  Resp 20  Ht 5\' 1"  (1.549 m)  Wt 188 lb (85.276 kg)  BMI 35.54 kg/m2   SpO2 99% Physical Exam  Constitutional: She is oriented to person, place, and time. She appears well-developed.  HENT:  Head: Normocephalic.  Eyes: Conjunctivae and EOM are normal. No scleral icterus.  Neck: Neck supple. No thyromegaly present.  Cardiovascular: Normal rate and regular rhythm.  Exam reveals no gallop and no friction rub.   No murmur heard. Tachycardic   Pulmonary/Chest: No stridor. She has no wheezes. She has no rales. She exhibits no tenderness.  Mild wheezing bilaterally  Abdominal: She exhibits no distension. There is no tenderness. There is no rebound.  Musculoskeletal: Normal range of motion. She exhibits no edema.  Lymphadenopathy:    She has no cervical adenopathy.  Neurological: She is oriented to person, place, and time. She exhibits normal muscle tone. Coordination normal.  Skin: No rash noted. No erythema.  Psychiatric: She has a normal mood and affect. Her behavior is normal.  Anxious    ED Course  Procedures (including critical care time) DIAGNOSTIC STUDIES: Oxygen Saturation is 99% on RA, normal by my interpretation.    COORDINATION OF CARE: 7:05 PM- Pt advised of plan for treatment and pt agrees.  Labs Review Labs Reviewed  CBC WITH DIFFERENTIAL  BASIC METABOLIC PANEL    Imaging Review No results found.   EKG Interpretation None     CRITICAL CARE Performed by: Willett Lefeber L Total critical care time: 45 Critical care time was exclusive of separately billable procedures and treating other patients. Critical care was necessary to treat or prevent imminent or life-threatening deterioration. Critical care was time spent personally by me on the following activities: development of treatment plan with patient and/or surrogate as well as nursing, discussions with consultants, evaluation of patient's response to treatment, examination of patient, obtaining history from patient or surrogate, ordering and performing treatments and interventions,  ordering and review of laboratory studies, ordering and review of radiographic studies, pulse oximetry and re-evaluation of patient's condition.   MDM   Final diagnoses:  SOB (shortness of breath)   Admit upper gi bleed  I personally performed the services described in this documentation, which was scribed in my presence. The recorded information has been reviewed and is accurate.     Benny LennertJoseph L Cordaryl Decelles, MD 11/16/13 2126

## 2013-11-16 NOTE — ED Notes (Signed)
Pt has dry heaves.  Can hear out at nurses station.  States she feels nauseated.

## 2013-11-16 NOTE — ED Notes (Signed)
AC notified of need for Sandostatin from pharmacy.

## 2013-11-16 NOTE — ED Notes (Signed)
Hospitalist at bedside 

## 2013-11-16 NOTE — ED Notes (Signed)
MD at bedside. 

## 2013-11-16 NOTE — ED Notes (Signed)
EMS reports pt has had n/v and "passin blood" in emesis today.  EMS reports pt has been vomiting blood x 1 month and has history of cirrhosis of the liver.  EMS reports pt was very anxious upon their arrival and ran out of xanax on Monday.  EMS reports pt's HR 140-150 initially then decreased to 130.  Reports bp slightly elevated but has not been able to keep her medications down.  Reports HR decreased to 125.  Pt denies any pain.  Pt says just feels anxious and SOB.

## 2013-11-16 NOTE — H&P (Signed)
[facility] History and Physical  Cynthia Mathews Vue ZOX:[id number] DOB: 08/14/1955 DOA: 11/16/2013  Referring physician: Dr Estell HarpinZammit PCP: Cassell SmilesFUSCO,LAWRENCE J., MD   Chief Complaint: hematemesis  HPI: Cynthia Mathews Durell is a 58 y.o. female  Bloody emesis x6 today. After last episode started to feel dizzy and started w/ palpitations and SOB. Pt reports occasional hematemesis in the past but not this intense. Denies melena, fevers, abd pain, CP, syncope, HA, AMS. Taking lactulose daily w/ daily soft BM.   Just completed a course of Harvoni for Hep c ([facility])  Review of Systems:  Per HPI w/ all other systems negative  Past Medical History  Diagnosis Date  . Cirrhosis   . Hypertension   . Anxiety    Past Surgical History  Procedure Laterality Date  . Shoulder surgery    . Humerus fracture surgery     Social History:  reports that she has been smoking.  She does not have any smokeless tobacco history on file. She reports that she does not drink alcohol or use illicit drugs.  Allergies  Allergen Reactions  . Codeine Nausea Only    No family history on file.   Prior to Admission medications   Medication Sig Start Date End Date Taking? Authorizing Provider  ALPRAZolam Prudy Feeler(XANAX) 0.5 MG tablet Take 0.5 mg by mouth daily as needed. FOR ANXIETY 12/17/12  Yes Historical Provider, MD  amLODipine (NORVASC) 10 MG tablet Take 10 mg by mouth daily. 01/13/13  Yes Historical Provider, MD  citalopram (CELEXA) 20 MG tablet Take 20 mg by mouth at bedtime.   Yes Historical Provider, MD  dicyclomine (BENTYL) 20 MG tablet Take 20 mg by mouth daily. 12/17/12  Yes Historical Provider, MD  furosemide (LASIX) 40 MG tablet Take 40 mg by mouth daily.   Yes Historical Provider, MD  labetalol (NORMODYNE) 100 MG tablet Take 50 mg by mouth 2 (two) times daily.   Yes Historical Provider, MD  lactulose (CHRONULAC) 10 GM/15ML solution Take 30 g by mouth 4 (four) times daily as needed. For  constipation.   Yes Historical Provider, MD  Multiple Vitamin (MULTIVITAMIN WITH MINERALS) TABS Take 1 tablet by mouth daily.   Yes Historical Provider, MD  rifaximin (XIFAXAN) 550 MG TABS Take 550 mg by mouth 2 (two) times daily.   Yes Historical Provider, MD  spironolactone (ALDACTONE) 25 MG tablet Take 25 mg by mouth daily.   Yes Historical Provider, MD  HYDROcodone-acetaminophen (NORCO) 5-325 MG per tablet Take 1-2 tabs by mouth every 6 hours when necessary pain. Patient not taking: Reported on 11/16/2013 06/29/11   [name], MD  Ledipasvir-Sofosbuvir (HARVONI) 90-400 MG TABS Take 1 tablet by mouth daily. 04/01/13   Historical Provider, MD  Thiamine HCl (VITAMIN B-1 PO) Take 1 tablet by mouth daily.    Historical Provider, MD   Physical Exam: Filed Vitals:   11/16/13 1828 11/16/13 1949 11/16/13 2100 11/16/13 2143  BP: 150/71 155/71  137/57  Pulse: 116 115 118 120  Temp: 98.7 F (37.1 C)     TempSrc: Oral     Resp: 20 20 27 24   Height: 5\' 1"  (1.549 m)     Weight: 85.276 kg (188 lb)     SpO2: 99% 100% 100% 99%    Wt Readings from Last 3 Encounters:  11/16/13 85.276 kg (188 lb)  06/14/12 84.369 kg (186 lb)  03/17/11 68.493 kg (151 lb)    General: Appears anxious Eyes:  PERRL, normal lids, irises &  conjunctiva. No scleral icterus ENT: Dry mucus membranses Neck:  no LAD, masses or thyromegaly Cardiovascular:  RRR, no m/r/g. No LE edema. Telemetry: Tachy,  SR, no arrhythmias  Respiratory:  CTA bilaterally, no w/r/r. Normal respiratory effort. Abdomen:  soft, ntnd Skin:  no rash or induration seen on limited exam Musculoskeletal:  grossly normal tone BUE/BLE Psychiatric:  grossly normal mood and affect, speech fluent and appropriate Neurologic:  grossly non-focal.          Labs on Admission:  Basic Metabolic Panel:  Recent Labs Lab 11/16/13 1836  NA 139  K 3.7  CL 101  CO2 18*  GLUCOSE 96  BUN 5*  CREATININE 0.48*  CALCIUM 8.2*   Liver Function  Tests:  Recent Labs Lab 11/16/13 1836  AST 157*  ALT 44*  ALKPHOS 105  BILITOT 3.9*  PROT 8.0  ALBUMIN 2.5*   No results for input(Mathews): LIPASE, AMYLASE in the last 168 hours.  Recent Labs Lab 11/16/13 1924  AMMONIA 45   CBC:  Recent Labs Lab 11/16/13 1836  WBC 2.8*  NEUTROABS 1.6*  HGB 12.6  HCT 35.3*  MCV 103.8*  PLT 43*   Cardiac Enzymes: No results for input(Mathews): CKTOTAL, CKMB, CKMBINDEX, TROPONINI in the last 168 hours.  BNP (last 3 results) No results for input(Mathews): PROBNP in the last 8760 hours. CBG: No results for input(Mathews): GLUCAP in the last 168 hours.  Radiological Exams on Admission: Dg Chest Port 1 View  11/16/2013   CLINICAL DATA:  Vomiting blood. Shortness of breath today. History of cirrhosis.  EXAM: PORTABLE CHEST - 1 VIEW  COMPARISON:  06/04/2010  FINDINGS: Heart and mediastinal contours are within normal limits. Scarring at the left lung base. Mild peribronchial thickening and prominent interstitial markings, similar prior study. No confluent opacities or effusions. No acute bony abnormality.  IMPRESSION: Mild peribronchial thickening and interstitial prominence, similar to prior study. This may reflect mild chronic bronchitis.  Lingular scarring.   Electronically Signed   By: Charlett NoseKevin  Dover M.D.   On: 11/16/2013 19:22    EKG: Independently reviewed. Afib  Assessment/Plan Active Problems:   Upper GI bleed   Hematemesis   Hepatic cirrhosis due to chronic hepatitis C infection   Hypertension   Anxiety   Tachycardia   Neutropenia   A-fib  Hematemesis: likely secondary to bleeding esophageal varices. Hgb nml at 12.6. Dr. Jena Gaussourk consulted by ED and has agreed to see the pt in the morning for possible upper endoscopy. Octreotide given in ED. Type and screen done - Admit  - NPO after midnight - f/u Dr. Luvenia Starchourk'Mathews recommendations  - Continue Octreotide  - Zofran - continue home bblocker - CBC in am  Afib: Noted on read by EKG machine. ???? P waves  present w/ equal distance between contractions. Exam was regular and Tele shows sinus tach. Pt denies h/o Afib. Pt autoanticoagulated w/ INR 1.7 adn Plt count of 43. Sinus tach  (Not Afib w/ RVR) is felt to be due to dehydration and anxiety - Tele - repeat EKG - Cards consult if pt develops true or sustained Afib.  - troponin - TSH, free T4 - A1c - NS 1L bolus  Hepatitis C Cirrhosis: Pt [facility]. Ammonia 45. AST/ALT 157/44. Bili3.9, Cr 0.48. MELD score 18. Worse than last admission. Neutropenia likely secondary to Harvoni - continue Lasix and Spironolactone, bblocker - continue lactulose - ETOH level - UDS - Xifaxan  HTN: did not take medications this am -  Continue Labetolol and norvasc  Anxiety: likely contributing to sinus tach - Ativan IV in place of home PO xanax  Tobacco: Continues to smoke 1/2 ppd - nicotine patch    Code Status: FULL DVT Prophylaxis: Plts 43 Family Communication: none Disposition Plan: pending improvement    Arisbeth Purrington J [facility] [email or web address] Password [email or web address]

## 2013-11-17 ENCOUNTER — Encounter (HOSPITAL_COMMUNITY): Payer: Self-pay | Admitting: Internal Medicine

## 2013-11-17 DIAGNOSIS — Z720 Tobacco use: Secondary | ICD-10-CM

## 2013-11-17 DIAGNOSIS — K922 Gastrointestinal hemorrhage, unspecified: Secondary | ICD-10-CM

## 2013-11-17 DIAGNOSIS — B1920 Unspecified viral hepatitis C without hepatic coma: Secondary | ICD-10-CM | POA: Diagnosis present

## 2013-11-17 DIAGNOSIS — D62 Acute posthemorrhagic anemia: Secondary | ICD-10-CM | POA: Diagnosis present

## 2013-11-17 DIAGNOSIS — D696 Thrombocytopenia, unspecified: Secondary | ICD-10-CM | POA: Diagnosis present

## 2013-11-17 DIAGNOSIS — D689 Coagulation defect, unspecified: Secondary | ICD-10-CM | POA: Diagnosis present

## 2013-11-17 DIAGNOSIS — B182 Chronic viral hepatitis C: Secondary | ICD-10-CM

## 2013-11-17 HISTORY — DX: Tobacco use: Z72.0

## 2013-11-17 LAB — CBC
HCT: 34 % — ABNORMAL LOW (ref 36.0–46.0)
HEMATOCRIT: 30.9 % — AB (ref 36.0–46.0)
HEMOGLOBIN: 10.8 g/dL — AB (ref 12.0–15.0)
HEMOGLOBIN: 11.8 g/dL — AB (ref 12.0–15.0)
MCH: 37.1 pg — ABNORMAL HIGH (ref 26.0–34.0)
MCH: 37.1 pg — AB (ref 26.0–34.0)
MCHC: 35 g/dL (ref 30.0–36.0)
MCHC: 34.7 g/dL (ref 30.0–36.0)
MCV: 106.2 fL — ABNORMAL HIGH (ref 78.0–100.0)
MCV: 106.9 fL — AB (ref 78.0–100.0)
Platelets: 33 10*3/uL — ABNORMAL LOW (ref 150–400)
Platelets: 39 10*3/uL — ABNORMAL LOW (ref 150–400)
RBC: 2.91 MIL/uL — ABNORMAL LOW (ref 3.87–5.11)
RBC: 3.18 MIL/uL — ABNORMAL LOW (ref 3.87–5.11)
RDW: 15.5 % (ref 11.5–15.5)
RDW: 15.4 % (ref 11.5–15.5)
WBC: 2.5 10*3/uL — AB (ref 4.0–10.5)
WBC: 3 10*3/uL — ABNORMAL LOW (ref 4.0–10.5)

## 2013-11-17 LAB — COMPREHENSIVE METABOLIC PANEL
ALBUMIN: 2.1 g/dL — AB (ref 3.5–5.2)
ALK PHOS: 78 U/L (ref 39–117)
ALT: 35 U/L (ref 0–35)
AST: 122 U/L — ABNORMAL HIGH (ref 0–37)
Anion gap: 11 (ref 5–15)
BILIRUBIN TOTAL: 4.7 mg/dL — AB (ref 0.3–1.2)
BUN: 8 mg/dL (ref 6–23)
CHLORIDE: 104 meq/L (ref 96–112)
CO2: 23 mEq/L (ref 19–32)
Calcium: 7.3 mg/dL — ABNORMAL LOW (ref 8.4–10.5)
Creatinine, Ser: 0.6 mg/dL (ref 0.50–1.10)
GFR calc Af Amer: >90 mL/min (ref >90)
GFR calc non Af Amer: >90 mL/min (ref >90)
Glucose, Bld: 88 mg/dL (ref 70–99)
POTASSIUM: 4 meq/L (ref 3.7–5.3)
Sodium: 138 mEq/L (ref 137–147)
Total Protein: 6.7 g/dL (ref 6.0–8.3)

## 2013-11-17 LAB — TSH: TSH: 1.25 u[IU]/mL (ref 0.350–4.500)

## 2013-11-17 LAB — RAPID URINE DRUG SCREEN, HOSP PERFORMED
AMPHETAMINES: NOT DETECTED
BARBITURATES: NOT DETECTED
BENZODIAZEPINES: NOT DETECTED
COCAINE: NOT DETECTED
Opiates: NOT DETECTED
Tetrahydrocannabinol: NOT DETECTED

## 2013-11-17 LAB — T4, FREE: Free T4: 0.91 ng/dL (ref 0.80–1.80)

## 2013-11-17 LAB — GLUCOSE, CAPILLARY: GLUCOSE-CAPILLARY: 124 mg/dL — AB (ref 70–99)

## 2013-11-17 LAB — MRSA PCR SCREENING: MRSA by PCR: NEGATIVE

## 2013-11-17 LAB — PROTIME-INR
INR: 1.99 — AB (ref 0.00–1.49)
PROTHROMBIN TIME: 22.8 s — AB (ref 11.6–15.2)

## 2013-11-17 LAB — APTT: aPTT: 43 seconds — ABNORMAL HIGH (ref 24–37)

## 2013-11-17 LAB — HEMOGLOBIN A1C
Hgb A1c MFr Bld: 5.2 % (ref <5.7)
MEAN PLASMA GLUCOSE: 103 mg/dL (ref <117)

## 2013-11-17 MED ORDER — PANTOPRAZOLE SODIUM 40 MG IV SOLR: 40.0000 mg | Freq: Two times a day (BID) | INTRAVENOUS | Status: DC

## 2013-11-17 MED ORDER — CEFTRIAXONE SODIUM IN DEXTROSE 20 MG/ML IV SOLN
1.0000 g | INTRAVENOUS | Status: AC
  Administered 2013-11-17 – 2013-11-22 (×6): 1 g via INTRAVENOUS
  Filled 2013-11-17: qty 50
  Filled 2013-11-17: qty 20
  Filled 2013-11-17 (×6): qty 50

## 2013-11-17 MED ORDER — VITAMIN K1 10 MG/ML IJ SOLN
10.0000 mg | Freq: Once | INTRAVENOUS | Status: AC
  Administered 2013-11-17: 10 mg via INTRAVENOUS
  Filled 2013-11-17: qty 1

## 2013-11-17 MED ORDER — LORAZEPAM 2 MG/ML IJ SOLN
0.5000 mg | Freq: Three times a day (TID) | INTRAMUSCULAR | Status: DC | PRN
  Administered 2013-11-19: 0.5 mg via INTRAVENOUS
  Filled 2013-11-17: qty 1

## 2013-11-17 MED ORDER — ONDANSETRON HCL 4 MG/2ML IJ SOLN
4.0000 mg | Freq: Four times a day (QID) | INTRAMUSCULAR | Status: DC
  Administered 2013-11-17 – 2013-11-23 (×24): 4 mg via INTRAVENOUS
  Filled 2013-11-17 (×24): qty 2

## 2013-11-17 MED ORDER — FUROSEMIDE 10 MG/ML IJ SOLN
10.0000 mg | Freq: Two times a day (BID) | INTRAMUSCULAR | Status: DC
  Administered 2013-11-17: 10 mg via INTRAVENOUS
  Filled 2013-11-17: qty 2

## 2013-11-17 MED ORDER — METOPROLOL TARTRATE 1 MG/ML IV SOLN
5.0000 mg | Freq: Four times a day (QID) | INTRAVENOUS | Status: DC | PRN
  Filled 2013-11-17: qty 5

## 2013-11-17 MED ORDER — VITAMIN K1 10 MG/ML IJ SOLN
5.0000 mg | Freq: Once | INTRAMUSCULAR | Status: AC
  Administered 2013-11-17: 5 mg via SUBCUTANEOUS
  Filled 2013-11-17: qty 1

## 2013-11-17 MED ORDER — SODIUM CHLORIDE 0.9 % IV SOLN
8.0000 mg/h | INTRAVENOUS | Status: DC
  Administered 2013-11-17 – 2013-11-18 (×3): 8 mg/h via INTRAVENOUS
  Filled 2013-11-17 (×7): qty 80

## 2013-11-17 MED ORDER — METOPROLOL TARTRATE 1 MG/ML IV SOLN
5.0000 mg | Freq: Four times a day (QID) | INTRAVENOUS | Status: DC
  Administered 2013-11-17: 5 mg via INTRAVENOUS
  Filled 2013-11-17: qty 5

## 2013-11-17 MED ORDER — POTASSIUM CHLORIDE IN NACL 20-0.9 MEQ/L-% IV SOLN
INTRAVENOUS | Status: DC
  Administered 2013-11-17 (×2): via INTRAVENOUS
  Administered 2013-11-18: 1000 mL via INTRAVENOUS
  Administered 2013-11-18: 19:00:00 via INTRAVENOUS
  Filled 2013-11-17 (×6): qty 1000

## 2013-11-17 MED ORDER — SODIUM CHLORIDE 0.9 % IV SOLN: Freq: Once | INTRAVENOUS | Status: DC

## 2013-11-17 MED ORDER — SODIUM CHLORIDE 0.9 % IV SOLN
80.0000 mg | Freq: Once | INTRAVENOUS | Status: AC
  Administered 2013-11-17: 80 mg via INTRAVENOUS
  Filled 2013-11-17: qty 80

## 2013-11-17 NOTE — Progress Notes (Signed)
Spoke with Case Management about the patient's financial instability. The pt is concerned about affording her medication.

## 2013-11-17 NOTE — Progress Notes (Addendum)
[facility] PROGRESS NOTE  VINETTA BRACH OZH:[id number] DOB: [birth year] DOA: 11/16/2013 PCP: Cassell Smiles., MD     Code Status:full code Family Communication: discussed with sister Disposition Plan: the plan is to transfer to [facility], hospital for definitive diagnosis and treatment, per family request.   Consultants:  Gastroenterologist, Dr. Jena Gauss  Procedures:  none  Antibiotics:  Rocephin 11/17/13>>  11/16/13 Levaquin IV 1  HPI/Subjective: The patient had a small amount of emesis this morning, but had multiple episodes of hematemesis yesterday. She denies abdominal pain, but has a sore throat which she believes is from vomiting. She denies black tarry stools or bright red blood per rectum. She takes occasional Aleve for shoulder pain, but no other NSAIDs. She denies alcohol use.  Objective: Filed Vitals:   11/17/13 1000  BP: 98/70  Pulse: 119  Temp:   Resp: 22   Blood pressure 132/68. Heart rate 111. Respiratory rate 19. Oxygen saturation 97%.   Intake/Output Summary (Last 24 hours) at 11/17/13 1006 Last data filed at 11/17/13 0700  Gross per 24 hour  Intake 897.08 ml  Output    600 ml  Net 297.08 ml   Filed Weights   11/16/13 1828 11/16/13 2211 11/17/13 0500  Weight: 85.276 kg (188 lb) 87.7 kg (193 lb 5.5 oz) 87.7 kg (193 lb 5.5 oz)    Exam:   General:  Alert overweight Caucasian woman sitting up in bed in no acute distress, but she appears ill.  Cardiovascular: S1, S2, with mild tachycardia.  Respiratory: decreased breath sounds in the bases, otherwise clear.  Abdomen: mildly obese, positive bowel sounds, soft, nontender, nondistended.  Musculoskeletal/extremities: Trace of pedal edema bilaterally. No acute hot red joints.  Neurologic: She is alert and oriented 3. Cranial nerves II through XII are intact.   Data Reviewed: Basic Metabolic Panel:  Recent Labs Lab 11/16/13 1836 11/17/13 0606  NA 139 138  K 3.7 4.0  CL 101 104   CO2 18* 23  GLUCOSE 96 88  BUN 5* 8  CREATININE 0.48* 0.60  CALCIUM 8.2* 7.3*   Liver Function Tests:  Recent Labs Lab 11/16/13 1836 11/17/13 0606  AST 157* 122*  ALT 44* 35  ALKPHOS 105 78  BILITOT 3.9* 4.7*  PROT 8.0 6.7  ALBUMIN 2.5* 2.1*   No results for input(s): LIPASE, AMYLASE in the last 168 hours.  Recent Labs Lab 11/16/13 1924  AMMONIA 45   CBC:  Recent Labs Lab 11/16/13 1836 11/17/13 0606  WBC 2.8* 2.5*  NEUTROABS 1.6*  --   HGB 12.6 10.8*  HCT 35.3* 30.9*  MCV 103.8* 106.2*  PLT 43* 33*   Cardiac Enzymes: No results for input(s): CKTOTAL, CKMB, CKMBINDEX, TROPONINI in the last 168 hours. BNP (last 3 results) No results for input(s): PROBNP in the last 8760 hours. CBG: No results for input(s): GLUCAP in the last 168 hours.  Recent Results (from the past 240 hour(s))  MRSA PCR Screening     Status: None   Collection Time: 11/16/13 10:37 PM  Result Value Ref Range Status   MRSA by PCR NEGATIVE NEGATIVE Final    Comment:        The GeneXpert MRSA Assay (FDA approved for NASAL specimens only), is one component of a comprehensive MRSA colonization surveillance program. It is not intended to diagnose MRSA infection nor to guide or monitor treatment for MRSA infections.      Studies: Dg Chest Port 1 View  11/16/2013   CLINICAL DATA:  Vomiting blood. Shortness  of breath today. History of cirrhosis.  EXAM: PORTABLE CHEST - 1 VIEW  COMPARISON:  06/04/2010  FINDINGS: Heart and mediastinal contours are within normal limits. Scarring at the left lung base. Mild peribronchial thickening and prominent interstitial markings, similar prior study. No confluent opacities or effusions. No acute bony abnormality.  IMPRESSION: Mild peribronchial thickening and interstitial prominence, similar to prior study. This may reflect mild chronic bronchitis.  Lingular scarring.   Electronically Signed   By: Charlett NoseKevin  Dover M.D.   On: 11/16/2013 19:22    Scheduled  Meds: . antiseptic oral rinse  7 mL Mouth Rinse BID  . cefTRIAXone (ROCEPHIN)  IV  1 g Intravenous Q24H  . furosemide  10 mg Intravenous Q12H  . nicotine  14 mg Transdermal Daily  . ondansetron (ZOFRAN) IV  4 mg Intravenous 4 times per day  . pantoprazole (PROTONIX) IV  80 mg Intravenous Once  . [START ON 11/20/2013] pantoprazole (PROTONIX) IV  40 mg Intravenous Q12H   Continuous Infusions: . 0.9 % NaCl with KCl 20 mEq / L 100 mL/hr at 11/17/13 0916  . octreotide  (SANDOSTATIN)    IV infusion 25 mcg/hr (11/17/13 0600)  . pantoprozole (PROTONIX) infusion     Assessment and plan:  Principal Problem:   Upper GI bleed Active Problems:   Hematemesis   Hepatic cirrhosis due to chronic hepatitis C infection   Acute blood loss anemia   Coagulopathy   Hypertension   Anxiety   Tachycardia   Neutropenia   Tobacco abuse   Thrombocytopenia  1. Upper GI bleed/hematemesis in the setting of HCV cirrhosis, coagulopathy, and thrombocytopenia. Etiology unknown, but the differential diagnoses include esophageal varices, gastric varices/portal gastropathy,and peptic ulcer disease. Per history, her last EGD 07/2012 revealed no varices and positive for portal hypertensive gastropathy. Last abdominal ultrasound 10/2013 revealed HCC and cholelithiasis. CT of her abdomen in 2011 showed large collateral vein extending from the portal vein in the epigastric region inferiorly along the left inferior epigastric artery to the left inserts into the left common femoral vein. -Octreotide and Protonix drip started. -GI added IV ceftriaxone empirically. -Plan on transfusion of 1 unit of fresh frozen plasma and 1 unit of platelets today. -Patient already typed and screened for packed red blood cells. Continue following CBC every 6 hours. -Discussed with gastroenterologist Dr. Jena Gaussourk, via PA this Melvyn NethLewis.EGD is recommended, but anesthesiology is unwilling to intubate the patient with coagulopathy and thrombocytopenia. She  needs to be at a tertiary care center. [facility], but the patient would [facility] and [facility]. -Her systolic blood pressure is above 100, but she is mildly tachycardic. -She was may completely nothing by mouth. -I have discussed the patient with intensivist, [facility] Mol[facility] who agrees to take her on his service in the ICU. He will consult gastroenterology when she arrives.  Mild acute blood loss anemia. No indication for packed red blood cell transfusion as her hemoglobin is relatively stable for now. We'll continue to monitor CBC every 6 hours. She has been typed and screened.  Hepatitis C Cirrhosis Patient is followed [facility]Zacks [facility]. She recently completed treatment with Harvoni (24 weeks). Her liver transaminases are moderately elevated and she has a total bilirubin of 3.9 with indirect of 2.3.   Leukopenia/thrombocytopenia/coagulopathy secondary to hepatitis C cirrhosis. In the setting of GI bleeding, we'll transfuse 1 unit of platelets and 1 unit of  fresh frozen plasma. We'll also give her 5 mg of vitamin K subcutaneous 1. -continue to follow blood counts.  Sinus tachycardia. Multifactorial including volume depletion and rebound off of beta blocker. Initial EKG was read as atrial fibrillation, but on my exam, there was no ectopy. -We'll continue IV fluid hydration and transfusion of fresh frozen plasma and platelets. -We will start low-dose IV metoprolol scheduled while she is nothing by mouth for her oral beta blocker.  Tobacco abuse. The patient was advised to stop smoking. Continue nicotine patch.  Chronic anxiety. Continue when necessary lorazepam IV via she is nothing by mouth. Restart Xanax when she is not nothing by mouth.   Time spent: Total ICU time 45 minutes.    [facility] CountyFISHER,Katrice Goel  [facility] Pager [phone]. If 7PM-7AM, please contact night-coverage at  [email or web address], password [email or web address] 11/17/2013, 10:06 AM  LOS: 1 day

## 2013-11-17 NOTE — Care Management Utilization Note (Signed)
UR review complete.  

## 2013-11-17 NOTE — Progress Notes (Signed)
Purse and medications were taken by sister Boone MasterCherrie

## 2013-11-17 NOTE — Plan of Care (Signed)
Problem: Consults Goal: GI Bleeding Patient Education See Patient Education Module for education specifics. Patient admitted after episode at home in bathroom with large amount of blood during vomiting episode.  Patient with cirrhosis of the liver, is seeing a specialist at [facility]. Goal: Skin Care Protocol Initiated - if Braden Score 18 or less If consults are not indicated, leave blank or document N/A Outcome: Progressing  Problem: Phase I Progression Outcomes Goal: Pain controlled with appropriate interventions Outcome: Completed/Met Date Met:  11/17/13 No complaints of pain Goal: OOB as tolerated unless otherwise ordered Outcome: Progressing Up to [facility] Goal: Initial discharge plan identified Outcome: Progressing To home Goal: Voiding-avoid urinary catheter unless indicated Outcome: Completed/Met Date Met:  11/17/13 Completed met Goal: Other Phase I Outcomes/Goals Outcome: Progressing HR mildly elevated but patient remains in ST since admission to the floor, labetolol 41m po given
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
## 2013-11-17 NOTE — H&P (Signed)
PULMONARY / CRITICAL CARE MEDICINE   Name: Cynthia GravelKathy S Billingham MRN: [id number] DOB: 02/07/1955    ADMISSION DATE:  11/16/2013 CONSULTATION DATE:  11/17/2013   REFERRING MD : Sherrie MustacheFisher  CHIEF COMPLAINT: GI bleed with SOB  INITIAL PRESENTATION:  [facility]11 with SOB, dizziness, palpitation and hematemesis.  Was admitted with an UGI bleed and ultimately [facility] for further eval and possible EGD.   STUDIES:   SIGNIFICANT EVENTS: 11/11 - pt admitted to [facility] for UGI bleed 11/12 - [facility] for further management and intervention    HISTORY OF PRESENT ILLNESS:    Cynthia Mathews is a 58 y/o female with history of cirrhosis, Hep C, and HTN who presented 11/11 to [facility] ED for SOB, dizziness, palpitations and 6 episodes of hematemesis in the last 24 hours with 6 months history of intermittent hematemesis, and most recent episode one week ago.  She is a pt at [employer], recently completed Harvoni tx for Hep C and has been evaluated for hematemesis.   GI at [facility] admitted pt and work-up revealed decreased platelets with concerned for gastric variceal bleed, however there were several concerns with thrombocytopenia, lack of GI coverage and lack of blood bank supplies, so pt was [facility] for further medical management and GI intervention.   Patient [facility] get [facility] but she refused transfer there.  PAST MEDICAL HISTORY :   has a past medical history of Cirrhosis; Hypertension; Anxiety; Hepatitis C; and Tobacco abuse (11/17/2013).  has past surgical history that includes Shoulder surgery and Humerus fracture surgery.   Prior to Admission medications   Medication Sig Start Date End Date Taking? Authorizing Provider  ALPRAZolam Prudy Feeler(XANAX) 0.5 MG tablet Take 0.5 mg by mouth daily as needed. FOR ANXIETY 12/17/12  Yes Historical Provider, MD  amLODipine (NORVASC) 10 MG tablet Take 10 mg by mouth daily. 01/13/13  Yes Historical Provider, MD   citalopram (CELEXA) 20 MG tablet Take 20 mg by mouth at bedtime.   Yes Historical Provider, MD  dicyclomine (BENTYL) 20 MG tablet Take 20 mg by mouth daily. 12/17/12  Yes Historical Provider, MD  furosemide (LASIX) 40 MG tablet Take 40 mg by mouth daily.   Yes Historical Provider, MD  labetalol (NORMODYNE) 100 MG tablet Take 50 mg by mouth 2 (two) times daily.   Yes Historical Provider, MD  lactulose (CHRONULAC) 10 GM/15ML solution Take 30 g by mouth 4 (four) times daily as needed. For constipation.   Yes Historical Provider, MD  Multiple Vitamin (MULTIVITAMIN WITH MINERALS) TABS Take 1 tablet by mouth daily.   Yes Historical Provider, MD  rifaximin (XIFAXAN) 550 MG TABS Take 550 mg by mouth 2 (two) times daily.   Yes Historical Provider, MD  spironolactone (ALDACTONE) 25 MG tablet Take 25 mg by mouth daily.   Yes Historical Provider, MD  HYDROcodone-acetaminophen (NORCO) 5-325 MG per tablet Take 1-2 tabs by mouth every 6 hours when necessary pain. Patient not taking: Reported on 11/16/2013 06/29/11   [name], MD  Ledipasvir-Sofosbuvir (HARVONI) 90-400 MG TABS Take 1 tablet by mouth daily. 04/01/13   Historical Provider, MD  LORazepam (ATIVAN) 0.5 MG tablet Take 0.5 mg by mouth every 8 (eight) hours as needed. For anxiety.    Historical Provider, MD  Thiamine HCl (VITAMIN B-1 PO) Take 1 tablet by mouth daily.    Historical Provider, MD   Allergies  Allergen Reactions  . Codeine Nausea  Only    FAMILY HISTORY:  indicated that her mother is deceased. She indicated that her father is deceased. She indicated that her sister is alive.  SOCIAL HISTORY:  reports that she has been smoking.  She does not have any smokeless tobacco history on file. She reports that she does not drink alcohol or use illicit drugs.  REVIEW OF SYSTEMS:  Bolds are positive  Constitutional: weight loss, gain, night sweats, Fevers, chills, fatigue .  HEENT: headaches, Sore throat, sneezing, nasal congestion, post  nasal drip, Difficulty swallowing, Tooth/dental problems, visual complaints visual changes, ear ache CV:  chest pain, radiates: ,Orthopnea, PND, swelling in lower extremities, dizziness, palpitations, syncope.  GI  heartburn, indigestion, abdominal pain, nausea, vomiting, diarrhea, change in bowel habits, loss of appetite, bloody stools.  Resp: cough, productive: , hemoptysis, dyspnea, chest pain, pleuritic.  Skin: rash or itching or icterus GU: dysuria, change in color of urine, urgency or frequency. flank pain, hematuria  MS: joint pain or swelling. decreased range of motion  Psych: change in mood or affect. depression or anxiety.  Neuro: difficulty with speech, weakness, numbness, ataxia   SUBJECTIVE:  [facility] transferred from [facility] for GI eval for EGD.  States nausea and vomiting have improved with zofran.  Has not other complaints.  Denies palpitations and SOB.  VITAL SIGNS: Temp:  [97.8 F (36.6 C)-99 F (37.2 C)] 98.3 F (36.8 C) (11/12 1335) Pulse Rate:  [101-120] 101 (11/12 1335) Resp:  [14-28] 22 (11/12 1335) BP: (91-155)/(45-102) 115/58 mmHg (11/12 1152) SpO2:  [90 %-100 %] 92 % (11/12 1335) Weight:  [188 lb (85.276 kg)-193 lb 5.5 oz (87.7 kg)] 193 lb 5.5 oz (87.7 kg) (11/12 0500) HEMODYNAMICS:   VENTILATOR SETTINGS:   INTAKE / OUTPUT:  Intake/Output Summary (Last 24 hours) at 11/17/13 1505 Last data filed at 11/17/13 1335  Gross per 24 hour  Intake 2549.43 ml  Output    600 ml  Net 1949.43 ml   PHYSICAL EXAMINATION: General:  Obese female, non-toxic appearing in NAD Neuro:  A&O x3, PERRL HEENT:  [place] AT, no scleral icterus, oral mucosa moist Cardiovascular:  Sinus tach with no M, G, R Lungs:  CTA A&P, no wheeze, rhonchi Abdomen:  Soft, non-tender, BS + Musculoskeletal:  Normal strength and ROM Skin:  Warm, dry, no pitting edema  LABS:  CBC  Recent Labs Lab 11/16/13 1836 11/17/13 0606 11/17/13 0910  WBC 2.8* 2.5* 3.0*  HGB 12.6 10.8* 11.8*  HCT 35.3* 30.9*  34.0*  PLT 43* 33* 39*   Coag's  Recent Labs Lab 11/16/13 2054 11/17/13 0606  APTT  --  43*  INR 1.71* 1.99*   BMET  Recent Labs Lab 11/16/13 1836 11/17/13 0606  NA 139 138  K 3.7 4.0  CL 101 104  CO2 18* 23  BUN 5* 8  CREATININE 0.48* 0.60  GLUCOSE 96 88   Electrolytes  Recent Labs Lab 11/16/13 1836 11/17/13 0606  CALCIUM 8.2* 7.3*   Sepsis Markers No results for input(s): LATICACIDVEN, PROCALCITON, O2SATVEN in the last 168 hours. ABG No results for input(s): PHART, PCO2ART, PO2ART in the last 168 hours. Liver Enzymes  Recent Labs Lab 11/16/13 1836 11/17/13 0606  AST 157* 122*  ALT 44* 35  ALKPHOS 105 78  BILITOT 3.9* 4.7*  ALBUMIN 2.5* 2.1*   Cardiac Enzymes No results for input(s): TROPONINI, PROBNP in the last 168 hours. Glucose  Recent Labs Lab 11/17/13 1312  GLUCAP 124*    [facility]  11/16/2013   CLINICAL DATA:  Vomiting blood. Shortness of breath today. History of cirrhosis.  EXAM: PORTABLE CHEST - 1 VIEW  COMPARISON:  06/04/2010  FINDINGS: Heart and mediastinal contours are within normal limits. Scarring at the left lung base. Mild peribronchial thickening and prominent interstitial markings, similar prior study. No confluent opacities or effusions. No acute bony abnormality.  IMPRESSION: Mild peribronchial thickening and interstitial prominence, similar to prior study. This may reflect mild chronic bronchitis.  Lingular scarring.   Electronically Signed   By: Charlett Nose M.D.   On: 11/16/2013 19:22     ASSESSMENT / PLAN:  GASTROINTESTINAL A: Upper GI Bleed Hepatic cirrhosis due to chronic Hep C Nausea P:   Consult GI anticipate for EGD Protonix and octreotide for UGIB Zofran Ammonia level  Home lactulose, xifaxan on hold for now  NPO for possible EGD  PULMONARY A: Tobacco abuse P:   Nicotine patch Maintain sats > 92  CARDIOVASCULAR A:  Tachycardia - in the setting of GIB, watching H/H, bolus with  fluid.   ??AFib - now appears sinus tach.  Hypotension Hx of HTN P:  IVF @ 100 Hold Lopressor/amlodopine in setting of GIB, hypotension D/c home lasix  F/u EKG  Monitor rhythm   RENAL A:  No acute issues P:   Monitor fluid status with I/O and BMP  HEMATOLOGIC A: Thrombocytopenia -  Platelets 33 Acute blood loss anemia  Neutropenia Coagulopathy - r/t liver dz P:  Transfused FFP Vitamin K q6 CBC F/u coags   INFECTIOUS A: Chronic hep C - treated at [facility]  P:   Ceftriaxone 11/12 >>    ENDOCRINE A:   No acute issues P:   Monitor bg with BMP's TSH wnl.   NEUROLOGIC A:  Hx of anxiety and hepatic encephalopathy P:   Monitor MS/ammonia with lactulose on hold  Continue home dose celexa when taking po's  Hold home xanax Consider PRN ativan   Hemodynamically stable.  Mild hypotension.  Awake, alert, appropriate.  Anticipate EGD.  If remains stable post procedure can likely tx to SDU.   Dirk Dress, NP 11/17/2013  3:05 PM Pager: [phone] or [phone]  Will admit to ICU, EGD planned for AM, platelet and FFP and vitamin K have been given.  Will transfer to SDU and to [facility] to pick up in AM with PCCM off.  Patient seen and examined, agree with above note.  I dictated the care and orders written for this patient under my direction.  Alyson Reedy, MD [phone]

## 2013-11-17 NOTE — Progress Notes (Signed)
Pt resting in bed. VSS. Report given to [facility] RN and ICU RN at [facility]. IV patent. No further c/o of nausea and vomitting. Sister at the bedside. [facility] to be [facility] via [facility].

## 2013-11-17 NOTE — Progress Notes (Signed)
Patient had moderate amount of bloody emesis this am, with wrenching type vomiting.

## 2013-11-17 NOTE — Progress Notes (Signed)
[facility] had blood tinged urine. No new orders. Will continue to monitor closely

## 2013-11-17 NOTE — Consult Note (Signed)
Referring Provider: Elliot Cousinenise Fisher, MD Primary Care Physician:  Cassell SmilesFUSCO,LAWRENCE J., MD Primary Gastroenterologist:  Cynthia Mathews, [facility]  Reason for Consultation:  hematemsis  HPI: Cynthia GravelKathy S Mathews is a 58 y.o. female admitted with multiple episodes of hematemesis. Patient reports intermittent symptoms off and on for couple months. [facility]. She got concerned when she had increased frequency of episodes and increased volume of blood in her emesis. Worse in the last couple of days. Describes large volume bright red hematemesis with each episode. Last time this morning. She has had a poor appetite. Complains of 10-12 pound weight loss. Denies abdominal pain. A little "foggy" couple of nights ago but last episode of significant encephalopathy several years ago when she was hospitalized here. Denies any melena or rectal bleeding. No heartburn. Occasional Advil for shoulder pain. No aspirin products.  [facility] records reviewed: HCV cirrhosis, genotype 1b [facility]. Recently completed Harvoni (24 weeks). 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 Last seen by Dr. Jacqualine MauZacks in 10/2013. Most recent EGD 07/2012 with no varices, +portal hypertensive gastropathy. Last abd u/s 10/2013 with no evidence of HCC, +cholelithiasis. TCS 2011, report not available.  Platelet count in the 33,000 range. Hemoglobin 10.8 this morning, had been 12.6 yesterday. INR 1.99.  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    Prior to Admission medications   Medication Sig Start Date End Date Taking? Authorizing Provider  ALPRAZolam Prudy Feeler(XANAX) 0.5 MG tablet Take 0.5 mg by mouth daily as needed. FOR ANXIETY 12/17/12  Yes Historical Provider, MD  amLODipine (NORVASC) 10 MG tablet Take 10 mg by mouth daily. 01/13/13  Yes  Historical Provider, MD  citalopram (CELEXA) 20 MG tablet Take 20 mg by mouth at bedtime.   Yes Historical Provider, MD  dicyclomine (BENTYL) 20 MG tablet Take 20 mg by mouth daily. 12/17/12  Yes Historical Provider, MD  furosemide (LASIX) 40 MG tablet Take 40 mg by mouth daily.   Yes Historical Provider, MD  labetalol (NORMODYNE) 100 MG tablet Take 50 mg by mouth 2 (two) times daily.   Yes Historical Provider, MD  lactulose (CHRONULAC) 10 GM/15ML solution Take 30 g by mouth 4 (four) times daily as needed. For constipation.   Yes Historical Provider, MD  Multiple Vitamin (MULTIVITAMIN WITH MINERALS) TABS Take 1 tablet by mouth daily.   Yes Historical Provider, MD  rifaximin (XIFAXAN) 550 MG TABS Take 550 mg by mouth 2 (two) times daily.   Yes Historical Provider, MD  spironolactone (ALDACTONE) 25 MG tablet Take 25 mg by mouth daily.   Yes Historical Provider, MD          Ledipasvir-Sofosbuvir (HARVONI) 90-400 MG TABS Take 1 tablet by mouth daily. 04/01/13   Historical Provider, MD  LORazepam (ATIVAN) 0.5 MG tablet Take 0.5 mg by mouth every 8 (eight) hours as needed. For anxiety.    Historical Provider, MD  Thiamine HCl (VITAMIN B-1 PO) Take 1 tablet by mouth daily.    Historical Provider, MD    Current Facility-Administered Medications  Medication Dose Route Frequency Provider Last Rate Last Dose  . 0.45 % sodium chloride infusion   Intravenous Continuous Ozella Rocksavid J Merrell, MD 100 mL/hr at 11/17/13 0700    . amLODipine (NORVASC) tablet 10 mg  10 mg Oral Daily Ozella Rocksavid J Merrell, MD      .  antiseptic oral rinse (CPC / CETYLPYRIDINIUM CHLORIDE 0.05%) solution 7 mL  7 mL Mouth Rinse BID Ozella Rocks, MD   7 mL at 11/16/13 2338  . citalopram (CELEXA) tablet 20 mg  20 mg Oral QHS Ozella Rocks, MD      . furosemide (LASIX) injection 20 mg  20 mg Intravenous Daily Ozella Rocks, MD      . Influenza vac split quadrivalent PF (FLUARIX) injection 0.5 mL  0.5 mL Intramuscular Tomorrow-1000 Ozella Rocks, MD      . labetalol (NORMODYNE) tablet 50 mg  50 mg Oral BID Ozella Rocks, MD   50 mg at 11/16/13 2336  . lactulose (CHRONULAC) 10 GM/15ML solution 30 g  30 g Oral BID Ozella Rocks, MD      . LORazepam (ATIVAN) injection 0.5-1 mg  0.5-1 mg Intravenous Q8H PRN Ozella Rocks, MD   1 mg at 11/17/13 [phone]  . nicotine (NICODERM CQ - dosed in mg/24 hours) patch 14 mg  14 mg Transdermal Daily Ozella Rocks, MD      . octreotide (SANDOSTATIN) 500 mcg in sodium chloride 0.9 % 250 mL (2 mcg/mL) infusion  25 mcg/hr Intravenous Continuous Benny Lennert, MD 12.5 mL/hr at 11/17/13 0600 25 mcg/hr at 11/17/13 0600  . rifaximin (XIFAXAN) tablet 550 mg  550 mg Oral BID Ozella Rocks, MD      . spironolactone (ALDACTONE) tablet 25 mg  25 mg Oral Daily Ozella Rocks, MD        Allergies as of 11/16/2013 - Review Complete 11/16/2013  Allergen Reaction Noted  . Codeine Nausea Only 06/29/2011    Past Medical History  Diagnosis Date  . Cirrhosis   . Hypertension   . Anxiety   . Hepatitis C   . Tobacco abuse 11/17/2013    Past Surgical History  Procedure Laterality Date  . Shoulder surgery    . Humerus fracture surgery      Family History  Problem Relation Age of Onset  . COPD Mother   . CAD Sister   . Diabetes Mellitus II Sister     History   Social History  . Marital Status: Divorced    Spouse Name: N/A    Number of Children: N/A  . Years of Education: N/A   Occupational History  . Not on file.   Social History Main Topics  . Smoking status: Current Every Day Smoker -- 0.50 packs/day  . Smokeless tobacco: Not on file  . Alcohol Use: No  . Drug Use: No  . Sexual Activity: Yes    Birth Control/ Protection: Post-menopausal   Other Topics Concern  . Not on file   Social History Narrative     ROS:  General: Negative for  fever, chills, fatigue, weakness. See hpi Eyes: Negative for vision changes.  ENT: Negative for hoarseness, difficulty swallowing ,  nasal congestion. CV: Negative for chest pain, angina, palpitations, dyspnea on exertion, peripheral edema.  Respiratory: Negative for dyspnea at rest, dyspnea on exertion, cough, sputum, wheezing.  GI: See history of present illness. GU:  Negative for dysuria, hematuria, urinary incontinence, urinary frequency, nocturnal urination.  MS: Negative for  ow back pain. +shoulder pain Derm: Negative for rash or itching.  Neuro: Negative for weakness, abnormal sensation, seizure, frequent headaches, memory loss, confusion.  Psych: Negative for anxiety, depression, suicidal ideation, hallucinations.  Endo: see hpi.  Heme: Negative for bruising or bleeding. Allergy: Negative for rash or hives.  Physical Examination: Vital signs in last 24 hours: Temp:  [98.2 F (36.8 C)-99 F (37.2 C)] 98.2 F (36.8 C) (11/12 0400) Pulse Rate:  [103-120] 111 (11/12 0630) Resp:  [14-28] 19 (11/12 0700) BP: (91-155)/(45-84) 132/68 mmHg (11/12 0700) SpO2:  [92 %-100 %] 97 % (11/12 0630) Weight:  [188 lb (85.276 kg)-193 lb 5.5 oz (87.7 kg)] 193 lb 5.5 oz (87.7 kg) (11/12 0500) Last BM Date: 11/16/13  General: Well-nourished, well-developed in no acute distress.  Head: Normocephalic, atraumatic.   Eyes: Conjunctiva pink, no icterus. Mouth: Oropharyngeal mucosa moist and pink , no lesions erythema or exudate. Neck: Supple without thyromegaly, masses, or lymphadenopathy.  Lungs: Clear to auscultation bilaterally.  Heart: Regular rate and rhythm, no murmurs rubs or gallops.  Abdomen: Bowel sounds are normal, nontender, nondistended, no hepatosplenomegaly or masses, no abdominal bruits or    hernia , no rebound or guarding.   Rectal: not performed Extremities: No lower extremity edema, clubbing, deformity.  Neuro: Alert and oriented x 4 , grossly normal neurologically.  Skin: Warm and dry, no rash or jaundice.   Psych: Alert and cooperative, normal mood and affect.        Intake/Output from previous  day: 11/11 0701 - 11/12 0700 In: 897.1 [I.V.:897.1] Out: 600 [Urine:600] Intake/Output this shift:    Lab Results: CBC  Recent Labs  11/16/13 1836 11/17/13 0606  WBC 2.8* 2.5*  HGB 12.6 10.8*  HCT 35.3* 30.9*  MCV 103.8* 106.2*  PLT 43* 33*   BMET  Recent Labs  11/16/13 1836 11/17/13 0606  NA 139 138  K 3.7 4.0  CL 101 104  CO2 18* 23  GLUCOSE 96 88  BUN 5* 8  CREATININE 0.48* 0.60  CALCIUM 8.2* 7.3*   LFT  Recent Labs  11/16/13 1836 11/17/13 0606  BILITOT 3.9* 4.7*  BILIDIR 1.6*  --   IBILI 2.3*  --   ALKPHOS 105 78  AST 157* 122*  ALT 44* 35  PROT 8.0 6.7  ALBUMIN 2.5* 2.1*    Lipase No results for input(s): LIPASE in the last 72 hours.  PT/INR  Recent Labs  11/16/13 2054 11/17/13 0606  LABPROT 20.2* 22.8*  INR 1.71* 1.99*    Ammonia 45  Labs from 10/12/13: Tbili 3.4, AP 139, AST 144, ALT 55, alb 2.9, Hgb 13.1, Platelet 69, INR 1.5  Imaging Studies: Dg Chest Port 1 View  11/16/2013   CLINICAL DATA:  Vomiting blood. Shortness of breath today. History of cirrhosis.  EXAM: PORTABLE CHEST - 1 VIEW  COMPARISON:  06/04/2010  FINDINGS: Heart and mediastinal contours are within normal limits. Scarring at the left lung base. Mild peribronchial thickening and prominent interstitial markings, similar prior study. No confluent opacities or effusions. No acute bony abnormality.  IMPRESSION: Mild peribronchial thickening and interstitial prominence, similar to prior study. This may reflect mild chronic bronchitis.  Lingular scarring.   Electronically Signed   By: Charlett NoseKevin  Dover M.D.   On: 11/16/2013 19:22  [4 week]   Impression: 58 year old lady with history of HCV cirrhosis, recently completed HCV treatment, who presents with worsening hematemesis. She has dropped her hemoglobin from 12.6 to 10.6 overnight. Hemoglobin of 13.1 month ago. Denies melena. She has significant hematemesis over the past 24-48 hours. Current MELD score of 15. Patient noted to have  significant drop in her platelet count, down in the 30,000 range today. 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 Last EGD in 2014 by Dr. Jacqualine Mau showed portal gastropathy. Would be concerned about possibly gastric variceal bleed. Bleeding from portal gastropathy in the setting of significant thrombocytopenia a possibility as well. Discussed with Dr. Jena Gauss who has discussed with anesthesiology here [facility]. Given the significant thrombocytopenia, lack of significant blood bank supplies, no GI coverage after tomorrow at 5 PM, it is felt his best interest of the patient to pursue transfer to [facility] for further management.   Plan: 1. Add Protonix drip. 2. Continue octreotide. 3. Ceftriaxone 1g daily for 7 days. 4. To discuss need for transfer with attending. 5. Continue to monitor Hgb, platelets until transfer. Transfuse as needed.  We would like to thank you for the opportunity to participate in the care of Cynthia Mathews.    LOS: 1 day   Tana Coast  11/17/2013, 7:42 AM  Addendum: Spoke with all parties, Dr. Jena Gauss, Dr. Sherrie Mustache, patient, sister (Nurse at [facility]). Plans for transfer to [facility] to care for reasons outlined. Patient is not interested in going to [facility], would prefer to stay closer to home given her support system is here locally. Plans for FFP, platelet today while waiting for transfer.

## 2013-11-18 ENCOUNTER — Inpatient Hospital Stay (HOSPITAL_COMMUNITY): Payer: BC Managed Care – PPO | Admitting: Anesthesiology

## 2013-11-18 ENCOUNTER — Encounter (HOSPITAL_COMMUNITY): Payer: Self-pay | Admitting: Anesthesiology

## 2013-11-18 ENCOUNTER — Encounter (HOSPITAL_COMMUNITY)
Admission: EM | Disposition: A | Discharge status: Home / Self Care | Payer: Self-pay | Source: Home / Self Care | Attending: Internal Medicine

## 2013-11-18 ENCOUNTER — Inpatient Hospital Stay (HOSPITAL_COMMUNITY): Payer: Self-pay | Admitting: Anesthesiology

## 2013-11-18 DIAGNOSIS — K766 Portal hypertension: Secondary | ICD-10-CM

## 2013-11-18 DIAGNOSIS — K3189 Other diseases of stomach and duodenum: Secondary | ICD-10-CM

## 2013-11-18 HISTORY — PX: ESOPHAGOGASTRODUODENOSCOPY: SHX5428

## 2013-11-18 LAB — COMPREHENSIVE METABOLIC PANEL
ALT: 29 U/L (ref 0–35)
AST: 93 U/L — AB (ref 0–37)
Albumin: 2.1 g/dL — ABNORMAL LOW (ref 3.5–5.2)
Alkaline Phosphatase: 64 U/L (ref 39–117)
Anion gap: 11 (ref 5–15)
BILIRUBIN TOTAL: 5 mg/dL — AB (ref 0.3–1.2)
BUN: 9 mg/dL (ref 6–23)
CO2: 24 meq/L (ref 19–32)
Calcium: 6.9 mg/dL — ABNORMAL LOW (ref 8.4–10.5)
Chloride: 101 mEq/L (ref 96–112)
Creatinine, Ser: 0.64 mg/dL (ref 0.50–1.10)
GFR calc Af Amer: >90 mL/min (ref >90)
Glucose, Bld: 127 mg/dL — ABNORMAL HIGH (ref 70–99)
Potassium: 3.9 mEq/L (ref 3.7–5.3)
SODIUM: 136 meq/L — AB (ref 137–147)
Total Protein: 6.2 g/dL (ref 6.0–8.3)

## 2013-11-18 LAB — AMMONIA: Ammonia: 70 umol/L — ABNORMAL HIGH (ref 11–60)

## 2013-11-18 LAB — URINALYSIS, ROUTINE W REFLEX MICROSCOPIC
Glucose, UA: NEGATIVE mg/dL
Hgb urine dipstick: NEGATIVE
Ketones, ur: 15 mg/dL — AB
Nitrite: POSITIVE — AB
PROTEIN: NEGATIVE mg/dL
Specific Gravity, Urine: 1.024 (ref 1.005–1.030)
UROBILINOGEN UA: 1 mg/dL (ref 0.0–1.0)
pH: 6 (ref 5.0–8.0)

## 2013-11-18 LAB — CBC
HCT: 28.2 % — ABNORMAL LOW (ref 36.0–46.0)
HCT: 28.9 % — ABNORMAL LOW (ref 36.0–46.0)
HEMOGLOBIN: 10.1 g/dL — AB (ref 12.0–15.0)
Hemoglobin: 9.8 g/dL — ABNORMAL LOW (ref 12.0–15.0)
MCH: 36.7 pg — ABNORMAL HIGH (ref 26.0–34.0)
MCH: 37.1 pg — ABNORMAL HIGH (ref 26.0–34.0)
MCHC: 34.8 g/dL (ref 30.0–36.0)
MCHC: 34.9 g/dL (ref 30.0–36.0)
MCV: 105.6 fL — ABNORMAL HIGH (ref 78.0–100.0)
MCV: 106.3 fL — AB (ref 78.0–100.0)
PLATELETS: 42 10*3/uL — AB (ref 150–400)
Platelets: 47 10*3/uL — ABNORMAL LOW (ref 150–400)
RBC: 2.67 MIL/uL — AB (ref 3.87–5.11)
RBC: 2.72 MIL/uL — AB (ref 3.87–5.11)
RDW: 15.5 % (ref 11.5–15.5)
RDW: 15.5 % (ref 11.5–15.5)
WBC: 2.6 10*3/uL — AB (ref 4.0–10.5)
WBC: 2.5 10*3/uL — AB (ref 4.0–10.5)

## 2013-11-18 LAB — PREPARE PLATELET PHERESIS: Unit division: 0

## 2013-11-18 LAB — PROTIME-INR
INR: 2.02 — AB (ref 0.00–1.49)
Prothrombin Time: 23 seconds — ABNORMAL HIGH (ref 11.6–15.2)

## 2013-11-18 LAB — URINE MICROSCOPIC-ADD ON

## 2013-11-18 LAB — PREPARE FRESH FROZEN PLASMA: Unit division: 0

## 2013-11-18 SURGERY — EGD (ESOPHAGOGASTRODUODENOSCOPY)
Anesthesia: Monitor Anesthesia Care

## 2013-11-18 MED ORDER — PANTOPRAZOLE SODIUM 40 MG PO TBEC
40.0000 mg | DELAYED_RELEASE_TABLET | Freq: Two times a day (BID) | ORAL | Status: DC
  Administered 2013-11-18 – 2013-11-23 (×10): 40 mg via ORAL
  Filled 2013-11-18 (×10): qty 1

## 2013-11-18 MED ORDER — LACTATED RINGERS IV SOLN
INTRAVENOUS | Status: DC | PRN
  Administered 2013-11-18: 11:00:00 via INTRAVENOUS

## 2013-11-18 MED ORDER — LACTATED RINGERS IV SOLN
INTRAVENOUS | Status: DC
  Administered 2013-11-18: 11:00:00 via INTRAVENOUS

## 2013-11-18 MED ORDER — LACTULOSE 10 GM/15ML PO SOLN
30.0000 g | Freq: Two times a day (BID) | ORAL | Status: DC
  Administered 2013-11-18 – 2013-11-21 (×5): 30 g via ORAL
  Filled 2013-11-18 (×7): qty 45

## 2013-11-18 MED ORDER — LIDOCAINE HCL (CARDIAC) 20 MG/ML IV SOLN
INTRAVENOUS | Status: DC | PRN
  Administered 2013-11-18: 60 mg via INTRAVENOUS

## 2013-11-18 MED ORDER — FENTANYL CITRATE 0.05 MG/ML IJ SOLN
INTRAMUSCULAR | Status: DC | PRN
  Administered 2013-11-18 (×2): 50 ug via INTRAVENOUS

## 2013-11-18 MED ORDER — FENTANYL CITRATE 0.05 MG/ML IJ SOLN
INTRAMUSCULAR | Status: AC
  Filled 2013-11-18: qty 2

## 2013-11-18 MED ORDER — MIDAZOLAM HCL 2 MG/2ML IJ SOLN
INTRAMUSCULAR | Status: AC
  Filled 2013-11-18: qty 2

## 2013-11-18 MED ORDER — PROPOFOL INFUSION 10 MG/ML OPTIME
INTRAVENOUS | Status: DC | PRN
  Administered 2013-11-18: 75 ug/kg/min via INTRAVENOUS

## 2013-11-18 MED ORDER — RIFAXIMIN 550 MG PO TABS
550.0000 mg | ORAL_TABLET | Freq: Two times a day (BID) | ORAL | Status: DC
  Administered 2013-11-18 – 2013-11-23 (×10): 550 mg via ORAL
  Filled 2013-11-18 (×11): qty 1

## 2013-11-18 MED ORDER — PROPOFOL 10 MG/ML IV BOLUS
INTRAVENOUS | Status: DC | PRN
  Administered 2013-11-18: 30 mg via INTRAVENOUS

## 2013-11-18 MED ORDER — BUTAMBEN-TETRACAINE-BENZOCAINE 2-2-14 % EX AERO
INHALATION_SPRAY | CUTANEOUS | Status: DC | PRN
  Administered 2013-11-18: 2 via TOPICAL

## 2013-11-18 MED ORDER — MIDAZOLAM HCL 5 MG/5ML IJ SOLN
INTRAMUSCULAR | Status: DC | PRN
  Administered 2013-11-18 (×2): 1 mg via INTRAVENOUS

## 2013-11-18 NOTE — Anesthesia Postprocedure Evaluation (Signed)
  Anesthesia Post-op Note  Patient: Cynthia Mathews  Procedure(s) Performed: Procedure(s): ESOPHAGOGASTRODUODENOSCOPY (EGD) (N/A)  Patient Location: PACU  Anesthesia Type:MAC  Level of Consciousness: awake  Airway and Oxygen Therapy: Patient Spontanous Breathing and Patient connected to nasal cannula oxygen  Post-op Pain: none  Post-op Assessment: Post-op Vital signs reviewed, Patient's Cardiovascular Status Stable, Respiratory Function Stable, Patent Airway, No signs of Nausea or vomiting and Pain level controlled  Post-op Vital Signs: Reviewed and stable  Last Vitals:  Filed Vitals:   11/18/13 1600  BP: 113/62  Pulse: 95  Temp: 36.8 C  Resp: 25    Complications: No apparent anesthesia complications

## 2013-11-18 NOTE — Op Note (Addendum)
[facility] [address] [place], [id number]   ENDOSCOPY PROCEDURE REPORT  PATIENT: Cynthia Mathews, Cynthia Mathews  MR#: [id number] BIRTHDATE: 01/24/1955 , 58  yrs. old GENDER: female ENDOSCOPIST: Rachael Feeaniel P Doyal Saric, MD PROCEDURE DATE:  11/18/2013 PROCEDURE:  EGD, diagnostic ASA CLASS:     Class IV INDICATIONS:  recent minor hematemesis (no melena); known cirrhosis, EGD 07/2012 Dr.  Jacqualine MauZacks found no varices but + portal gastropathy. MEDICATIONS: Monitored anesthesia care TOPICAL ANESTHETIC: none  DESCRIPTION OF PROCEDURE: After the risks benefits and alternatives of the procedure were thoroughly explained, informed consent was obtained.  The Pentax Gastroscope [id number] endoscope was introduced through the mouth and advanced to the second portion of the duodenum , Without limitations.  The instrument was slowly withdrawn as the mucosa was fully examined.  There was no recent or old blood in the UGI tract.  There was moderate portal gastritis throughout the stomach.  There were no clear gastric varices and no esophageal varices.  The examination was otherwise normal.  Retroflexed views revealed no abnormalities. The scope was then withdrawn from the patient and the procedure completed.  COMPLICATIONS: There were no immediate complications.  ENDOSCOPIC IMPRESSION: There was no recent or old blood in the UGI tract.  There was moderate portal gastritis throughout the stomach.  There were no clear gastric varices and no esophageal varices.  The examination was otherwise normal. The recent hematemesis was most likely from portal gastropathy.  RECOMMENDATIONS: Will change to oral PPI, will stop octreotide drip.  Should observe at least another 24 hours for overt GI bleeding.  She knows to follow up with Dr.  Jacqualine MauZacks as previously scheduled.  eSigned:  Rachael Feeaniel P Editha Bridgeforth, MD 11/18/2013 11:15 AM Revised: 11/18/2013 11:15 AM

## 2013-11-18 NOTE — Transfer of Care (Signed)
Immediate Anesthesia Transfer of Care Note  Patient: Cynthia Mathews  Procedure(s) Performed: Procedure(s): ESOPHAGOGASTRODUODENOSCOPY (EGD) (N/A)  Patient Location: Endoscopy Unit  Anesthesia Type:MAC  Level of Consciousness: awake, alert , oriented and patient cooperative  Airway & Oxygen Therapy: Patient Spontanous Breathing and Patient connected to nasal cannula oxygen  Post-op Assessment: Report given to PACU RN, Post -op Vital signs reviewed and stable and Patient moving all extremities  Post vital signs: Reviewed and stable  Complications: No apparent anesthesia complications

## 2013-11-18 NOTE — Plan of Care (Signed)
Problem: Phase II Progression Outcomes Goal: Hemodynamically stable Outcome: Completed/Met Date Met:  11/18/13 Goal: Tolerating diet Outcome: Completed/Met Date Met:  11/18/13

## 2013-11-18 NOTE — Plan of Care (Signed)
Problem: Phase II Progression Outcomes Goal: No active bleeding Outcome: Completed/Met Date Met:  11/18/13

## 2013-11-18 NOTE — Interval H&P Note (Signed)
History and Physical Interval Note:  11/18/2013 10:27 AM  Cynthia Mathews  has presented today for surgery, with the diagnosis of vomiting blood  The various methods of treatment have been discussed with the patient and family. After consideration of risks, benefits and other options for treatment, the patient has consented to  Procedure(s): ESOPHAGOGASTRODUODENOSCOPY (EGD) (N/A) as a surgical intervention .  The patient's history has been reviewed, patient examined, no change in status, stable for surgery.  I have reviewed the patient's chart and labs.  Questions were answered to the patient's satisfaction.     Rachael FeeJacobs, Daniel P

## 2013-11-18 NOTE — Anesthesia Preprocedure Evaluation (Signed)
Anesthesia Evaluation  Patient identified by MRN, date of birth, ID band Patient awake    Reviewed: Allergy & Precautions, H&P , NPO status , Patient's Chart, lab work & pertinent test results  History of Anesthesia Complications Negative for: history of anesthetic complications  Airway Mallampati: II  TM Distance: >3 FB Neck ROM: Full    Dental  (+) Teeth Intact   Pulmonary neg sleep apnea, neg COPDneg recent URI, Current Smoker,  breath sounds clear to auscultation        Cardiovascular hypertension, Pt. on medications - angina- Past MI and - CHF + dysrhythmias Atrial Fibrillation Rhythm:Regular     Neuro/Psych PSYCHIATRIC DISORDERS Anxiety negative neurological ROS     GI/Hepatic (+) Hepatitis -, CConcern for upper GI bleed, intermittent blood in vomit, not nauseated now.    Endo/Other  negative endocrine ROS  Renal/GU negative Renal ROS     Musculoskeletal   Abdominal   Peds  Hematology  (+) anemia ,   Anesthesia Other Findings   Reproductive/Obstetrics                             Anesthesia Physical Anesthesia Plan  ASA: III  Anesthesia Plan: MAC   Post-op Pain Management:    Induction: Intravenous  Airway Management Planned: Simple Face Mask and Natural Airway  Additional Equipment: None  Intra-op Plan:   Post-operative Plan:   Informed Consent: I have reviewed the patients History and Physical, chart, labs and discussed the procedure including the risks, benefits and alternatives for the proposed anesthesia with the patient or authorized representative who has indicated his/her understanding and acceptance.   Dental advisory given  Plan Discussed with: CRNA and Surgeon  Anesthesia Plan Comments:         Anesthesia Quick Evaluation
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
## 2013-11-18 NOTE — Progress Notes (Signed)
[place] TEAM 1 - Stepdown/ICU TEAM Progress Note  Catalina GravelKathy S Edrington BTD:[id number] DOB: 08/29/1955 DOA: 11/16/2013 PCP: Cassell SmilesFUSCO,LAWRENCE J., MD  Admit HPI / Brief Narrative61: 58 y/o female with history of cirrhosis, Hep C, and HTN who [facility]11 to [facility] ED for SOB, dizziness, palpitations and 6 episodes of hematemesis in 24 hours with a 6 month history of intermittent hematemesis. [facility], and recently completed Harvoni tx for Hep C as well as an evaluation for hematemesis.   GI at [facility] admitted pt and work-up revealed decreased platelets with concern for a gastric variceal bleed.  [facility] and GI intervention after [facility]   HPI/Subjective: Pt is groggy post endoscopy.  She has no complaints at present.  She denies cp, n/v, abdom pain, or sob.    Assessment/Plan:  Upper GI Bleed / Hematemesis Presumed to be due to portal gastropathy - s/p EGD w/ no clear varices but w/ moderate portal gastritis - cont oral PPI   Acute blood loss anemia  Due to above - Hgb appears to be stabilizing - recheck in AM   Hepatic cirrhosis due to chronic Hep C [facility] - completed 24 weeks Harvoni "2 weeks ago" - HCV RNA not detectable 07/2013 nor on 10/12/2013 - [facility]   Thrombocytopenia Transfused a pheresis unit of plts - follow trend   Coagulopathy Transfused FFP + a unit of plts - dosed w/ Vitamin K - has had little effect on coags - no further tx planned unless bleeding recurs   Tobacco abuse  Tachycardia - ?transient afib HR now controlled   Hypotension BP improving   Code Status: FULL Family Communication: no family present at time of exam Disposition Plan: SDU  Consultants: GI  Procedures: EGD - 11/13 - no blood present - no signif varices - portal gastropathy   Antibiotics: Levaquin 11/11 Rocephin 11/12 >  DVT prophylaxis: SCDs  Objective: Blood  pressure 107/55, pulse 89, temperature 98.3 F (36.8 C), temperature source Oral, resp. rate 22, height 5\' 1"  (1.549 m), weight 87.7 kg (193 lb 5.5 oz), SpO2 95 %.  Intake/Output Summary (Last 24 hours) at 11/18/13 1658 Last data filed at 11/18/13 1429  Gross per 24 hour  Intake 2476.04 ml  Output    225 ml  Net 2251.04 ml   Exam: General: No acute respiratory distress Lungs: Clear to auscultation bilaterally without wheezes or crackles Cardiovascular: Regular rate and rhythm without murmur gallop or rub normal S1 and S2 Abdomen: Nontender, nondistended, soft, bowel sounds positive, no rebound, no ascites, no appreciable mass Extremities: No significant cyanosis, clubbing, or edema bilateral lower extremities  Data Reviewed: Basic Metabolic Panel:  Recent Labs Lab 11/16/13 1836 11/17/13 0606 11/18/13 0225  NA 139 138 136*  K 3.7 4.0 3.9  CL 101 104 101  CO2 18* 23 24  GLUCOSE 96 88 127*  BUN 5* 8 9  CREATININE 0.48* 0.60 0.64  CALCIUM 8.2* 7.3* 6.9*    Liver Function Tests:  Recent Labs Lab 11/16/13 1836 11/17/13 0606 11/18/13 0225  AST 157* 122* 93*  ALT 44* 35 29  ALKPHOS 105 78 64  BILITOT 3.9* 4.7* 5.0*  PROT 8.0 6.7 6.2  ALBUMIN 2.5* 2.1* 2.1*    Recent Labs Lab 11/16/13 1924 11/18/13 0225  AMMONIA 45 70*    Coags:  Recent Labs Lab 11/16/13 2054 11/17/13  0606 11/18/13 0225  INR 1.71* 1.99* 2.02*    Recent Labs Lab 11/17/13 0606  APTT 43*    CBC:  Recent Labs Lab 11/16/13 1836 11/17/13 0606 11/17/13 0910 11/18/13 0225 11/18/13 0704  WBC 2.8* 2.5* 3.0* 2.6* 2.5*  NEUTROABS 1.6*  --   --   --   --   HGB 12.6 10.8* 11.8* 9.8* 10.1*  HCT 35.3* 30.9* 34.0* 28.2* 28.9*  MCV 103.8* 106.2* 106.9* 105.6* 106.3*  PLT 43* 33* 39* 47* 42*   CBG:  Recent Labs Lab 11/17/13 1312  GLUCAP 124*    Recent Results (from the past 240 hour(s))  MRSA PCR Screening     Status: None   Collection Time: 11/16/13 10:37 PM  Result Value Ref  Range Status   MRSA by PCR NEGATIVE NEGATIVE Final    Comment:        The GeneXpert MRSA Assay (FDA approved for NASAL specimens only), is one component of a comprehensive MRSA colonization surveillance program. It is not intended to diagnose MRSA infection nor to guide or monitor treatment for MRSA infections.      Studies:  Recent x-ray studies have been reviewed in detail by the Attending Physician  Scheduled Meds:  Scheduled Meds: . antiseptic oral rinse  7 mL Mouth Rinse BID  . cefTRIAXone (ROCEPHIN)  IV  1 g Intravenous Q24H  . fentaNYL      . midazolam      . nicotine  14 mg Transdermal Daily  . ondansetron (ZOFRAN) IV  4 mg Intravenous 4 times per day  . pantoprazole  40 mg Oral BID AC    Time spent on care of this patient: 35 mins   Izmael Duross T , MD   [facility] Office  [phone] Pager - Text Page per Loretha StaplerAmion as per below:  On-Call/Text Page:      [email or web address]      password [email or web address]  If 7PM-7AM, please contact night-coverage [email or web address] Password [email or web address] 11/18/2013, 4:58 PM   LOS: 2 days

## 2013-11-18 NOTE — Plan of Care (Signed)
Problem: Phase II Progression Outcomes Goal: H&H stablized < 1gm drop in 24 hrs Outcome: Completed/Met Date Met:  11/18/13

## 2013-11-19 LAB — PROTIME-INR
INR: 2 — ABNORMAL HIGH (ref 0.00–1.49)
PROTHROMBIN TIME: 22.8 s — AB (ref 11.6–15.2)

## 2013-11-19 LAB — APTT: APTT: 40 s — AB (ref 24–37)

## 2013-11-19 LAB — COMPREHENSIVE METABOLIC PANEL
ALT: 27 U/L (ref 0–35)
AST: 88 U/L — ABNORMAL HIGH (ref 0–37)
Albumin: 2 g/dL — ABNORMAL LOW (ref 3.5–5.2)
Alkaline Phosphatase: 64 U/L (ref 39–117)
Anion gap: 12 (ref 5–15)
BUN: 7 mg/dL (ref 6–23)
CALCIUM: 6.9 mg/dL — AB (ref 8.4–10.5)
CO2: 22 meq/L (ref 19–32)
Chloride: 103 mEq/L (ref 96–112)
Creatinine, Ser: 0.55 mg/dL (ref 0.50–1.10)
GFR calc Af Amer: >90 mL/min (ref >90)
GFR calc non Af Amer: >90 mL/min (ref >90)
Glucose, Bld: 109 mg/dL — ABNORMAL HIGH (ref 70–99)
Potassium: 3.6 mEq/L — ABNORMAL LOW (ref 3.7–5.3)
SODIUM: 137 meq/L (ref 137–147)
Total Bilirubin: 5.8 mg/dL — ABNORMAL HIGH (ref 0.3–1.2)
Total Protein: 6.5 g/dL (ref 6.0–8.3)

## 2013-11-19 LAB — CBC
HEMATOCRIT: 30.3 % — AB (ref 36.0–46.0)
HEMOGLOBIN: 10.4 g/dL — AB (ref 12.0–15.0)
MCH: 37.5 pg — AB (ref 26.0–34.0)
MCHC: 34.3 g/dL (ref 30.0–36.0)
MCV: 109.4 fL — ABNORMAL HIGH (ref 78.0–100.0)
Platelets: 43 10*3/uL — ABNORMAL LOW (ref 150–400)
RBC: 2.77 MIL/uL — ABNORMAL LOW (ref 3.87–5.11)
RDW: 15.3 % (ref 11.5–15.5)
WBC: 2.9 10*3/uL — ABNORMAL LOW (ref 4.0–10.5)

## 2013-11-19 LAB — AMMONIA: Ammonia: 67 umol/L — ABNORMAL HIGH (ref 11–60)

## 2013-11-19 MED ORDER — DICYCLOMINE HCL 20 MG PO TABS
20.0000 mg | ORAL_TABLET | Freq: Every day | ORAL | Status: DC
  Administered 2013-11-19 – 2013-11-23 (×5): 20 mg via ORAL
  Filled 2013-11-19 (×7): qty 1

## 2013-11-19 MED ORDER — LABETALOL HCL 100 MG PO TABS
50.0000 mg | ORAL_TABLET | Freq: Two times a day (BID) | ORAL | Status: DC
  Administered 2013-11-19 – 2013-11-23 (×9): 50 mg via ORAL
  Filled 2013-11-19 (×4): qty 0.5
  Filled 2013-11-19: qty 1
  Filled 2013-11-19 (×2): qty 0.5
  Filled 2013-11-19: qty 1
  Filled 2013-11-19 (×2): qty 0.5
  Filled 2013-11-19: qty 1
  Filled 2013-11-19 (×2): qty 0.5

## 2013-11-19 MED ORDER — CITALOPRAM HYDROBROMIDE 20 MG PO TABS
20.0000 mg | ORAL_TABLET | Freq: Every day | ORAL | Status: DC
  Administered 2013-11-19 – 2013-11-22 (×4): 20 mg via ORAL
  Filled 2013-11-19 (×8): qty 1

## 2013-11-19 MED ORDER — LORAZEPAM 0.5 MG PO TABS
0.5000 mg | ORAL_TABLET | Freq: Three times a day (TID) | ORAL | Status: DC | PRN
  Administered 2013-11-19 – 2013-11-21 (×3): 0.5 mg via ORAL
  Filled 2013-11-19 (×3): qty 1

## 2013-11-19 MED ORDER — VITAMIN B-1 100 MG PO TABS
100.0000 mg | ORAL_TABLET | Freq: Every day | ORAL | Status: DC
  Administered 2013-11-19 – 2013-11-23 (×5): 100 mg via ORAL
  Filled 2013-11-19 (×5): qty 1

## 2013-11-19 MED ORDER — ADULT MULTIVITAMIN W/MINERALS CH
1.0000 | ORAL_TABLET | Freq: Every day | ORAL | Status: DC
  Administered 2013-11-19 – 2013-11-23 (×5): 1 via ORAL
  Filled 2013-11-19 (×6): qty 1

## 2013-11-19 MED ORDER — LACTULOSE 10 GM/15ML PO SOLN
30.0000 g | Freq: Once | ORAL | Status: AC
  Administered 2013-11-19: 30 g via ORAL
  Filled 2013-11-19: qty 45

## 2013-11-19 NOTE — Progress Notes (Signed)
[place] TEAM 1 - Stepdown/ICU TEAM Progress Note  Catalina GravelKathy S Mah AVW:[id number] DOB: 08/22/1955 DOA: 11/16/2013 PCP: Cassell SmilesFUSCO,LAWRENCE J., MD  Admit HPI / Brief Narrative63: 58 y/o female with history of cirrhosis, Hep C, and HTN who [facility]11 [facility] for SOB, dizziness, palpitations and 6 episodes of hematemesis in 24 hours with a 6 month history of intermittent hematemesis. She is a pt [facility] [place], and recently completed Harvoni tx for Hep C as well as an evaluation for hematemesis.   GI at [facility] admitted pt and work-up revealed decreased platelets with concern for a gastric variceal bleed.  [facility] and GI intervention after [facility].   HPI/Subjective: Pt reports not sleeping last night.  She also relates poor oral intake, modest nausea, and gross hematuria which she reports [facility] been [facility] (no hx of a foley cath or recent I/O cath).  She denies vomiting/hematemsis.    Assessment/Plan:  Upper GI Bleed / Hematemesis Presumed to be due to portal gastropathy - s/p EGD w/ no clear varices but w/ moderate portal gastritis - cont oral PPI - compete 7 days of abx per GI rec - recheck CBC in AM   Gross hematuria Possibly simply due to coagulopathy - no hx to suggests stones - no recent instrumentation - no sx of obstruction - follow   Acute blood loss anemia  Due to above - Hgb appears to be stabilizing - recheck in AM   Hepatic cirrhosis due to chronic Hep C [facility] - completed 24 weeks Harvoni "2 weeks ago" - HCV RNA not detectable 07/2013 nor on 10/12/2013 - [facility]   Thrombocytopenia Transfused a pheresis unit of plts - plts appear to have stabilized   Coagulopathy Transfused FFP + a unit of plts - dosed w/ Vitamin K - has had little effect on coags - no further tx planned unless bleeding recurs   Tobacco abuse Counseled on absolute need to  quit  Tachycardia - ?transient afib In sinus tachy again this morning - possibly BB withdrawal - resume home BB dose and follow   Hypotension BP improving   Code Status: FULL Family Communication:  Spoke w/ sister at bedside  Disposition Plan: transfer to med bed - follow Hgb and hematuria and tachy (via vitals) - possible d/c home in AM   Consultants: GI  Procedures: EGD - 11/13 - no blood present - no signif varices - portal gastropathy   Antibiotics: Levaquin 11/11 Rocephin 11/12 >  DVT prophylaxis: SCDs  Objective: Blood pressure 133/69, pulse 110, temperature 98.6 F (37 C), temperature source Oral, resp. rate 19, height 5\' 1"  (1.549 m), weight 87.7 kg (193 lb 5.5 oz), SpO2 95 %.  Intake/Output Summary (Last 24 hours) at 11/19/13 0930 Last data filed at 11/19/13 0900  Gross per 24 hour  Intake 1687.71 ml  Output    100 ml  Net 1587.71 ml   Exam: General: No acute respiratory distress Lungs: Clear to auscultation bilaterally without wheezes/crackles Cardiovascular: Regular rhythm w/ mild tahcy - without murmur gallop or rub Abdomen: Nontender, nondistended, soft, bowel sounds positive, no rebound, no ascites, no appreciable mass Extremities: No significant cyanosis, clubbing, or edema bilateral lower extremities  Data Reviewed: Basic Metabolic Panel:  Recent Labs Lab 11/16/13 1836 11/17/13 0606 11/18/13 0225 11/19/13 0255  NA 139 138 136* 137  K 3.7  4.0 3.9 3.6*  CL 101 104 101 103  CO2 18* 23 24 22   GLUCOSE 96 88 127* 109*  BUN 5* 8 9 7   CREATININE 0.48* 0.60 0.64 0.55  CALCIUM 8.2* 7.3* 6.9* 6.9*    Liver Function Tests:  Recent Labs Lab 11/16/13 1836 11/17/13 0606 11/18/13 0225 11/19/13 0255  AST 157* 122* 93* 88*  ALT 44* 35 29 27  ALKPHOS 105 78 64 64  BILITOT 3.9* 4.7* 5.0* 5.8*  PROT 8.0 6.7 6.2 6.5  ALBUMIN 2.5* 2.1* 2.1* 2.0*    Recent Labs Lab 11/16/13 1924 11/18/13 0225 11/19/13 0255  AMMONIA 45 70* 67*     Coags:  Recent Labs Lab 11/16/13 2054 11/17/13 0606 11/18/13 0225 11/19/13 0255  INR 1.71* 1.99* 2.02* 2.00*    Recent Labs Lab 11/17/13 0606 11/19/13 0255  APTT 43* 40*    CBC:  Recent Labs Lab 11/16/13 1836 11/17/13 0606 11/17/13 0910 11/18/13 0225 11/18/13 0704 11/19/13 0255  WBC 2.8* 2.5* 3.0* 2.6* 2.5* 2.9*  NEUTROABS 1.6*  --   --   --   --   --   HGB 12.6 10.8* 11.8* 9.8* 10.1* 10.4*  HCT 35.3* 30.9* 34.0* 28.2* 28.9* 30.3*  MCV 103.8* 106.2* 106.9* 105.6* 106.3* 109.4*  PLT 43* 33* 39* 47* 42* 43*   CBG:  Recent Labs Lab 11/17/13 1312  GLUCAP 124*    Recent Results (from the past 240 hour(s))  MRSA PCR Screening     Status: None   Collection Time: 11/16/13 10:37 PM  Result Value Ref Range Status   MRSA by PCR NEGATIVE NEGATIVE Final    Comment:        The GeneXpert MRSA Assay (FDA approved for NASAL specimens only), is one component of a comprehensive MRSA colonization surveillance program. It is not intended to diagnose MRSA infection nor to guide or monitor treatment for MRSA infections.      Studies:  Recent x-ray studies have been reviewed in detail by the Attending Physician  Scheduled Meds:  Scheduled Meds: . antiseptic oral rinse  7 mL Mouth Rinse BID  . cefTRIAXone (ROCEPHIN)  IV  1 g Intravenous Q24H  . lactulose  30 g Oral BID  . nicotine  14 mg Transdermal Daily  . ondansetron (ZOFRAN) IV  4 mg Intravenous 4 times per day  . pantoprazole  40 mg Oral BID AC  . rifaximin  550 mg Oral BID    Time spent on care of this patient: 35 mins   MCCLUNG,JEFFREY T , MD   [facility] Office  [phone] Pager - Text Page per Loretha StaplerAmion as per below:  On-Call/Text Page:      [email or web address]      password [email or web address]  If 7PM-7AM, please contact night-coverage [email or web address] Password [email or web address] 11/19/2013, 9:30 AM   LOS: 3 days

## 2013-11-19 NOTE — Progress Notes (Signed)
Progress Note for [facility]  Subjective: No acute events.  Objective: Vital signs in last 24 hours: Temp:  [98.2 F (36.8 C)-98.8 F (37.1 C)] 98.6 F (37 C) (11/14 0739) Pulse Rate:  [89-103] 93 (11/14 0310) Resp:  [16-29] 26 (11/14 0310) BP: (107-135)/(55-65) 121/61 mmHg (11/14 0310) SpO2:  [93 %-97 %] 94 % (11/14 0310) Last BM Date: 11/17/13  Intake/Output from previous day: 11/13 0701 - 11/14 0700 In: 1850.2 [P.O.:180; I.V.:1620.2; IV Piggyback:50] Out: 100 [Urine:100] Intake/Output this shift:    General appearance: alert and no distress GI: soft, non-tender; bowel sounds normal; no masses,  no organomegaly  Lab Results:  Recent Labs  11/18/13 0225 11/18/13 0704 11/19/13 0255  WBC 2.6* 2.5* 2.9*  HGB 9.8* 10.1* 10.4*  HCT 28.2* 28.9* 30.3*  PLT 47* 42* 43*   BMET  Recent Labs  11/17/13 0606 11/18/13 0225 11/19/13 0255  NA 138 136* 137  K 4.0 3.9 3.6*  CL 104 101 103  CO2 23 24 22   GLUCOSE 88 127* 109*  BUN 8 9 7   CREATININE 0.60 0.64 0.55  CALCIUM 7.3* 6.9* 6.9*   LFT  Recent Labs  11/16/13 1836  11/19/13 0255  PROT 8.0  < > 6.5  ALBUMIN 2.5*  < > 2.0*  AST 157*  < > 88*  ALT 44*  < > 27  ALKPHOS 105  < > 64  BILITOT 3.9*  < > 5.8*  BILIDIR 1.6*  --   --   IBILI 2.3*  --   --   < > = values in this interval not displayed. PT/INR  Recent Labs  11/18/13 0225 11/19/13 0255  LABPROT 23.0* 22.8*  INR 2.02* 2.00*   Hepatitis Panel No results for input(s): HEPBSAG, HCVAB, HEPAIGM, HEPBIGM in the last 72 hours. C-Diff No results for input(s): CDIFFTOX in the last 72 hours. Fecal Lactopherrin No results for input(s): FECLLACTOFRN in the last 72 hours.  Studies/Results: No results found.  Medications:  Scheduled: . antiseptic oral rinse  7 mL Mouth Rinse BID  . cefTRIAXone (ROCEPHIN)  IV  1 g Intravenous Q24H  . lactulose  30 g Oral BID  . nicotine  14 mg Transdermal Daily  . ondansetron (ZOFRAN) IV  4 mg Intravenous 4 times  per day  . pantoprazole  40 mg Oral BID AC  . rifaximin  550 mg Oral BID   Continuous: . 0.9 % NaCl with KCl 20 mEq / L 50 mL/hr at 11/19/13 0000    Assessment/Plan: 1) HCV cirrhosis genotype 1b s/p Harvoni. 2) Severe portal HTN gastropathy. 3) Hematemesis.   The patient is currently stable.  No overt bleeding from the Portal HTN gastropathy.  Plan: 1) Follow HGB. 2) Antibiotic treatment for a total of 7 days in the setting of a cirrhotic with a GI bleed. 3) If bleeding an anemia become a persistent problem, beta blockers can be initiated.  LOS: 3 days   Cynthia Mathews D 11/19/2013, 7:51 AM

## 2013-11-20 LAB — CBC
HCT: 31 % — ABNORMAL LOW (ref 36.0–46.0)
Hemoglobin: 10.8 g/dL — ABNORMAL LOW (ref 12.0–15.0)
MCH: 37.8 pg — ABNORMAL HIGH (ref 26.0–34.0)
MCHC: 34.8 g/dL (ref 30.0–36.0)
MCV: 108.4 fL — ABNORMAL HIGH (ref 78.0–100.0)
PLATELETS: 40 10*3/uL — AB (ref 150–400)
RBC: 2.86 MIL/uL — AB (ref 3.87–5.11)
RDW: 15.3 % (ref 11.5–15.5)
WBC: 3.7 10*3/uL — AB (ref 4.0–10.5)

## 2013-11-20 LAB — URINE CULTURE
Colony Count: NO GROWTH
Culture: NO GROWTH

## 2013-11-20 LAB — AMMONIA: AMMONIA: 61 umol/L — AB (ref 11–60)

## 2013-11-20 LAB — COMPREHENSIVE METABOLIC PANEL
ALBUMIN: 2.1 g/dL — AB (ref 3.5–5.2)
ALK PHOS: 64 U/L (ref 39–117)
ALT: 34 U/L (ref 0–35)
AST: 112 U/L — AB (ref 0–37)
Anion gap: 12 (ref 5–15)
BUN: 7 mg/dL (ref 6–23)
CO2: 22 mEq/L (ref 19–32)
Calcium: 7.4 mg/dL — ABNORMAL LOW (ref 8.4–10.5)
Chloride: 101 mEq/L (ref 96–112)
Creatinine, Ser: 0.58 mg/dL (ref 0.50–1.10)
GFR calc Af Amer: >90 mL/min (ref >90)
GFR calc non Af Amer: >90 mL/min (ref >90)
Glucose, Bld: 95 mg/dL (ref 70–99)
POTASSIUM: 3.5 meq/L — AB (ref 3.7–5.3)
Sodium: 135 mEq/L — ABNORMAL LOW (ref 137–147)
Total Bilirubin: 8.9 mg/dL — ABNORMAL HIGH (ref 0.3–1.2)
Total Protein: 6.7 g/dL (ref 6.0–8.3)

## 2013-11-20 LAB — PROTIME-INR
INR: 2.24 — ABNORMAL HIGH (ref 0.00–1.49)
Prothrombin Time: 25 seconds — ABNORMAL HIGH (ref 11.6–15.2)

## 2013-11-20 MED ORDER — SPIRONOLACTONE 25 MG PO TABS
25.0000 mg | ORAL_TABLET | Freq: Every day | ORAL | Status: DC
  Administered 2013-11-20 – 2013-11-23 (×4): 25 mg via ORAL
  Filled 2013-11-20 (×4): qty 1

## 2013-11-20 MED ORDER — FUROSEMIDE 40 MG PO TABS
40.0000 mg | ORAL_TABLET | Freq: Every day | ORAL | Status: DC
  Administered 2013-11-20 – 2013-11-21 (×2): 40 mg via ORAL
  Filled 2013-11-20 (×2): qty 1

## 2013-11-20 MED ORDER — POTASSIUM CHLORIDE CRYS ER 20 MEQ PO TBCR
40.0000 meq | EXTENDED_RELEASE_TABLET | Freq: Once | ORAL | Status: AC
  Administered 2013-11-20: 40 meq via ORAL
  Filled 2013-11-20: qty 2

## 2013-11-20 MED ORDER — POTASSIUM CHLORIDE CRYS ER 20 MEQ PO TBCR: 40.0000 meq | EXTENDED_RELEASE_TABLET | Freq: Two times a day (BID) | ORAL | Status: DC

## 2013-11-20 NOTE — Progress Notes (Signed)
[facility] TEAM 1 - Stepdown/ICU TEAM Progress Note  Cynthia Mathews WUJ:[id number] DOB: [birth year] DOA: 11/16/2013 PCP: Cassell Smiles., MD  Admit HPI / Brief Narrative: 58 y/o female with history of cirrhosis, Hep C, and HTN who presented 11/11 to [facility] ED for SOB, dizziness, palpitations and 6 episodes of hematemesis in 24 hours with a 6 month history of intermittent hematemesis. She is a pt at [facility], and recently completed Harvoni tx for Hep C as well as an evaluation for hematemesis.   GI at [facility] admitted pt and work-up revealed decreased platelets with concern for a gastric variceal bleed.  The pt was transferred to [facility] for further medical management and GI intervention after she refused transfer to [facility].   HPI/Subjective: Pt felt "weak and unsteady" when attempting to ambulate around her room this morning.  She reports ongoing pink to redish urine, but no urinary hesitancy or retention sx.  Denies cp, abdom pain, or hematemesis.  Appetite is very poor.   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 treated at [facility] - completed 24 weeks Harvoni "2 weeks ago" - HCV RNA not detectable 07/2013 nor on 10/12/2013 - pt to f/u at [facility] post d/c   Thrombocytopenia Transfused a pheresis unit of plts - plts appear to have stabilized   Coagulopathy Transfused FFP + a unit of plts - dosed w/ Vitamin K - had little effect on coags - no further tx planned unless bleeding recurs   Tobacco abuse Counseled on absolute need to quit  Tachycardia -  ?transient afib likely due to BB withdrawal - resolved with resumption of home BB dose   Hypotension Resolved   Code Status: FULL Family Communication:  Spoke w/ signif other at bedside   Disposition Plan: PT/OT eval - possible d/c home in AM   Consultants: GI  Procedures: EGD - 11/13 - no blood present - no signif varices - portal gastropathy   Antibiotics: Levaquin 11/11 Rocephin 11/12 >  DVT prophylaxis: SCDs  Objective: Blood pressure 124/61, pulse 95, temperature 98.1 F (36.7 C), temperature source Oral, resp. rate 18, height 5\' 1"  (1.549 m), weight 87.7 kg (193 lb 5.5 oz), SpO2 92 %.  Intake/Output Summary (Last 24 hours) at 11/20/13 1131 Last data filed at 11/20/13 0944  Gross per 24 hour  Intake    480 ml  Output      0 ml  Net    480 ml   Exam: General: No acute respiratory distress Lungs: Clear to auscultation bilaterally without wheezes/crackles Cardiovascular: RRR - without murmur gallop or rub Abdomen: Nontender, nondistended, soft, bowel sounds positive, no rebound, no ascites, no appreciable mass Extremities: No significant cyanosis, clubbing;  trace edema bilateral lower extremities  Data Reviewed: Basic Metabolic Panel:  Recent Labs Lab 11/16/13 1836 11/17/13 0606 11/18/13 0225 11/19/13 0255 11/20/13 0555  NA 139 138 136* 137 135*  K 3.7 4.0 3.9 3.6* 3.5*  CL 101 104 101 103 101  CO2 18* 23 24 22 22   GLUCOSE 96 88 127* 109* 95  BUN 5* 8 9 7 7   CREATININE 0.48* 0.60 0.64 0.55 0.58  CALCIUM 8.2* 7.3* 6.9* 6.9* 7.4*    Liver Function Tests:  Recent Labs Lab 11/16/13 1836 11/17/13 0606 11/18/13 0225 11/19/13 0255 11/20/13 0555  AST 157* 122* 93* 88* 112*  ALT 44* 35 29 27 34  ALKPHOS 105 78 64 64 64  BILITOT 3.9* 4.7* 5.0* 5.8* 8.9*  PROT 8.0 6.7 6.2 6.5 6.7  ALBUMIN 2.5* 2.1* 2.1* 2.0* 2.1*    Recent Labs Lab 11/16/13 1924 11/18/13 0225 11/19/13 0255 11/20/13 0555  AMMONIA 45 70* 67* 61*    Coags:  Recent  Labs Lab 11/16/13 2054 11/17/13 0606 11/18/13 0225 11/19/13 0255 11/20/13 0555  INR 1.71* 1.99* 2.02* 2.00* 2.24*    Recent Labs Lab 11/17/13 0606 11/19/13 0255  APTT 43* 40*    CBC:  Recent Labs Lab 11/16/13 1836  11/17/13 0910 11/18/13 0225 11/18/13 0704 11/19/13 0255 11/20/13 0555  WBC 2.8*  < > 3.0* 2.6* 2.5* 2.9* 3.7*  NEUTROABS 1.6*  --   --   --   --   --   --   HGB 12.6  < > 11.8* 9.8* 10.1* 10.4* 10.8*  HCT 35.3*  < > 34.0* 28.2* 28.9* 30.3* 31.0*  MCV 103.8*  < > 106.9* 105.6* 106.3* 109.4* 108.4*  PLT 43*  < > 39* 47* 42* 43* 40*  < > = values in this interval not displayed. CBG:  Recent Labs Lab 11/17/13 1312  GLUCAP 124*    Recent Results (from the past 240 hour(s))  MRSA PCR Screening     Status: None   Collection Time: 11/16/13 10:37 PM  Result Value Ref Range Status   MRSA by PCR NEGATIVE NEGATIVE Final    Comment:        The GeneXpert MRSA Assay (FDA approved for NASAL specimens only), is one component of a comprehensive MRSA colonization surveillance program. It is not intended to diagnose MRSA infection nor to guide or monitor treatment for MRSA infections.   Culture, Urine     Status: None   Collection Time: 11/18/13  9:21 PM  Result Value Ref Range Status   Specimen Description URINE, RANDOM  Final   Special Requests NONE  Final   Culture  Setup Time   Final    11/19/2013 01:37 Performed at [facility]    Colony Count NO GROWTH Performed at [facility]   Final   Culture NO GROWTH Performed at [facility]   Final   Report Status 11/20/2013 FINAL  Final     Studies:  Recent x-ray studies have been reviewed in detail by the Attending Physician  Scheduled Meds:  Scheduled Meds: . antiseptic oral rinse  7 mL Mouth Rinse BID  . cefTRIAXone (ROCEPHIN)  IV  1 g Intravenous Q24H  . citalopram  20 mg Oral QHS  . dicyclomine  20 mg Oral Daily  . labetalol  50 mg Oral BID  . lactulose  30 g  Oral BID  . multivitamin with minerals  1 tablet Oral Daily  . nicotine  14 mg Transdermal Daily  . ondansetron (ZOFRAN) IV  4 mg Intravenous 4 times per day  . pantoprazole  40 mg Oral BID AC  . rifaximin  550 mg Oral BID  . thiamine  100 mg Oral Daily    Time spent on care of this patient:  25 mins   Lynzee Lindquist T , MD   [facility] Office  [phone] Pager - Text Page per Amion as per below:  On-Call/Text Page:      [email or web address]      password [email or web address]  If 7PM-7AM, please contact night-coverage [email or web address] Password [email or web address] 11/20/2013, 11:31 AM   LOS: 4 days

## 2013-11-21 ENCOUNTER — Encounter (HOSPITAL_COMMUNITY): Payer: Self-pay | Admitting: Gastroenterology

## 2013-11-21 LAB — CBC
HCT: 29.4 % — ABNORMAL LOW (ref 36.0–46.0)
HEMOGLOBIN: 10.2 g/dL — AB (ref 12.0–15.0)
MCH: 37.4 pg — AB (ref 26.0–34.0)
MCHC: 34.7 g/dL (ref 30.0–36.0)
MCV: 107.7 fL — AB (ref 78.0–100.0)
Platelets: 37 10*3/uL — ABNORMAL LOW (ref 150–400)
RBC: 2.73 MIL/uL — AB (ref 3.87–5.11)
RDW: 15.8 % — ABNORMAL HIGH (ref 11.5–15.5)
WBC: 3.8 10*3/uL — ABNORMAL LOW (ref 4.0–10.5)

## 2013-11-21 LAB — COMPREHENSIVE METABOLIC PANEL
ALBUMIN: 2 g/dL — AB (ref 3.5–5.2)
ALK PHOS: 65 U/L (ref 39–117)
ALT: 37 U/L — AB (ref 0–35)
AST: 111 U/L — ABNORMAL HIGH (ref 0–37)
Anion gap: 10 (ref 5–15)
BUN: 6 mg/dL (ref 6–23)
CO2: 22 mEq/L (ref 19–32)
Calcium: 7.3 mg/dL — ABNORMAL LOW (ref 8.4–10.5)
Chloride: 105 mEq/L (ref 96–112)
Creatinine, Ser: 0.57 mg/dL (ref 0.50–1.10)
GFR calc non Af Amer: >90 mL/min (ref >90)
GLUCOSE: 97 mg/dL (ref 70–99)
POTASSIUM: 3.5 meq/L — AB (ref 3.7–5.3)
SODIUM: 137 meq/L (ref 137–147)
Total Bilirubin: 9 mg/dL — ABNORMAL HIGH (ref 0.3–1.2)
Total Protein: 6.3 g/dL (ref 6.0–8.3)

## 2013-11-21 LAB — AMMONIA: Ammonia: 62 umol/L — ABNORMAL HIGH (ref 11–60)

## 2013-11-21 MED ORDER — PROMETHAZINE HCL 25 MG/ML IJ SOLN
12.5000 mg | Freq: Four times a day (QID) | INTRAMUSCULAR | Status: DC | PRN
  Administered 2013-11-21: 12.5 mg via INTRAVENOUS
  Filled 2013-11-21: qty 1

## 2013-11-21 MED ORDER — SODIUM CHLORIDE 0.9 % IV SOLN
INTRAVENOUS | Status: DC
  Administered 2013-11-21 – 2013-11-22 (×2): via INTRAVENOUS

## 2013-11-21 MED ORDER — LACTULOSE 10 GM/15ML PO SOLN
30.0000 g | Freq: Three times a day (TID) | ORAL | Status: DC
  Administered 2013-11-21 – 2013-11-22 (×4): 30 g via ORAL
  Filled 2013-11-21 (×10): qty 45

## 2013-11-21 MED ORDER — BOOST / RESOURCE BREEZE PO LIQD
1.0000 | Freq: Two times a day (BID) | ORAL | Status: DC
  Administered 2013-11-21 – 2013-11-22 (×2): 1 via ORAL

## 2013-11-21 MED ORDER — FUROSEMIDE 20 MG PO TABS: 20.0000 mg | ORAL_TABLET | Freq: Every day | ORAL | Status: DC

## 2013-11-21 NOTE — Evaluation (Signed)
Physical Therapy Evaluation Patient Details Name: Catalina GravelKathy S Calbert MRN: [id number] DOB: 11/17/1955 Today's Date: 11/21/2013   History of Present Illness  Admitted on 11/16/2013 with bloody emesis x6. Gastroenterology feels it is likely from portal gastropathy. She just completed a course of Harvoni for Hep c ([facility]wks [facility] PMHx: HTN, Hep C, cirrhosis and anxiety  Clinical Impression  Patient evaluated by Physical Therapy with no further acute PT needs identified. All education has been completed and the patient has no further questions.  See below for any follow-up Physial Therapy or equipment needs. PT is signing off. Thank you for this referral.     Follow Up Recommendations No PT follow up;Supervision - Intermittent    Equipment Recommendations  None recommended by PT    Recommendations for Other Services       Precautions / Restrictions Precautions Precautions: Fall Precaution Comments: due to dizziness Restrictions Weight Bearing Restrictions: No      Mobility  Bed Mobility Overal bed mobility: Independent                Transfers Overall transfer level: Independent Equipment used: None             General transfer comment: increased time  Ambulation/Gait Ambulation/Gait assistance: Supervision;Independent Ambulation Distance (Feet): 150 Feet Assistive device: None Gait Pattern/deviations: WFL(Within Functional Limits)     General Gait Details: supervision initially due to reports of mild dizziness; progressed to independent; encouraged pt to call for RN to walk in halls  Stairs Stairs: Yes Stairs assistance: Min guard Stair Management: One rail Left;Step to pattern;Forwards Number of Stairs: 3 General stair comments: noted fatigue/incr effort  Wheelchair Mobility    Modified Rankin (Stroke Patients Only)       Balance Overall balance assessment: Needs assistance Sitting-balance support: No upper extremity supported;Feet  supported Sitting balance-Leahy Scale: Good     Standing balance support: No upper extremity supported Standing balance-Leahy Scale: Fair                   Standardized Balance Assessment Standardized Balance Assessment : Berg Balance Test Berg Balance Test Sit to Stand: Able to stand without using hands and stabilize independently Standing Unsupported: Able to stand safely 2 minutes Sitting with Back Unsupported but Feet Supported on Floor or Stool: Able to sit safely and securely 2 minutes Stand to Sit: Sits safely with minimal use of hands Transfers: Able to transfer safely, minor use of hands Standing Unsupported with Eyes Closed: Able to stand 10 seconds safely Standing Ubsupported with Feet Together: Able to place feet together independently and stand 1 minute safely From Standing, Reach Forward with Outstretched Arm: Can reach confidently >25 cm (10") From Standing Position, Pick up Object from Floor: Able to pick up shoe safely and easily From Standing Position, Turn to Look Behind Over each Shoulder: Looks behind from both sides and weight shifts well Turn 360 Degrees: Able to turn 360 degrees safely in 4 seconds or less Standing Unsupported, Alternately Place Feet on Step/Stool: Able to complete 4 steps without aid or supervision Standing Unsupported, One Foot in Front: Able to plae foot ahead of the other independently and hold 30 seconds Standing on One Leg: Tries to lift leg/unable to hold 3 seconds but remains standing independently Total Score: 50         Pertinent Vitals/Pain Pain Assessment: No/denies pain    Home Living Family/patient expects to be discharged to:: Private residence Living Arrangements: Alone Available Help at Discharge:  Friend(s);Available PRN/intermittently Type of Home: House Home Access: Stairs to enter Entrance Stairs-Rails: Left Entrance Stairs-Number of Steps: 5 Home Layout: One level Home Equipment: None      Prior Function  Level of Independence: Independent         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     Hand Dominance   Dominant Hand: Right    Extremity/Trunk Assessment   Upper Extremity Assessment: Defer to OT evaluation           Lower Extremity Assessment: Overall WFL for tasks assessed      Cervical / Trunk Assessment: Normal  Communication   Communication: No difficulties  Cognition Arousal/Alertness: Awake/alert Behavior During Therapy: WFL for tasks assessed/performed Overall Cognitive Status: Within Functional Limits for tasks assessed                      General Comments General comments (skin integrity, edema, etc.): Denies h/o imbalance PTA ("except when I'm dizzy") Reports dizziness feels like a lightheadedness    Exercises        Assessment/Plan    PT Assessment Patent does not need any further PT services  PT Diagnosis Generalized weakness   PT Problem List    PT Treatment Interventions     PT Goals (Current goals can be found in the Care Plan section) Acute Rehab PT Goals Patient Stated Goal: home today hopefully PT Goal Formulation: All assessment and education complete, DC therapy    Frequency     Barriers to discharge        Co-evaluation               End of Session Equipment Utilized During Treatment: Gait belt Activity Tolerance: Patient tolerated treatment well Patient left: in bed;with call bell/phone within reach           Time: 1127-1141 PT Time Calculation (min) (ACUTE ONLY): 14 min   Charges:   PT Evaluation $Initial PT Evaluation Tier I: 1 Procedure     PT G Codes:          Shenee Wignall 11/21/2013, 11:51 AM Pager [phone]

## 2013-11-21 NOTE — Progress Notes (Signed)
INITIAL NUTRITION ASSESSMENT  DOCUMENTATION CODES Per approved criteria  -Obesity Unspecified   INTERVENTION: Resource Breeze po BID, each supplement provides 250 kcal and 9 grams of protein  NUTRITION DIAGNOSIS: Inadequate oral intake related to altered GI function as evidenced by nausea, PO: 25-50%.   Goal: Pt will meet >90% of estimated nutritional needs  Monitor:  PO/supplement intake, labs, weight changes, I/O's  Reason for Assessment: MST=2  58 y.o. female  Admitting Dx: Upper GI bleed  58 y/o female with history of cirrhosis, Hep C, and HTN who presented 11/11 to [facility] ED for SOB, dizziness, palpitations and 6 episodes of hematemesis in 24 hours with a 6 month history of intermittent hematemesis. She is a pt [facility], and recently completed Harvoni tx for Hep C as well as an evaluation for hematemesis.   ASSESSMENT: Hx obtained by pt and sister (who is an Charity fundraiserN at [employer]) at bedside.  Both confirm poor appetite over the past 7 days. Pt reports her biggest barrier to eating poorly is nausea. Pt sister on confirms that pt has a fair to good appetite at baseline.  Pt estimated that she has lost 12# over the past week, however, this is inconsistent with wt records. Pt sister suspects that pt is gaining wt due to fluid.  Noted 25-50% meal completion. Pt ate only few bites of pancake for breakfast this morning.  Nutrition focused physical exam revealed no signs of fat and muscle depletion. Educated pt on importance of good PO intake to promote healing. She is agreeable to try supplements- would like Resource Breeze due to nausea.  Labs reviewed. Calcium: 7.3, K: 3.5  Height: Ht Readings from Last 1 Encounters:  11/16/13 5\' 1"  (1.549 m)    Weight: Wt Readings from Last 1 Encounters:  11/17/13 193 lb 5.5 oz (87.7 kg)    Ideal Body Weight: 105#  % Ideal Body Weight: 184%  Wt Readings from Last 10 Encounters:  11/17/13 193 lb 5.5 oz (87.7 kg)  06/14/12 186  lb (84.369 kg)  03/17/11 151 lb (68.493 kg)    Usual Body Weight: 186#  % Usual Body Weight: 104%  BMI:  Body mass index is 36.55 kg/(m^2). Obesity, class II  Estimated Nutritional Needs: Kcal: 1900-2100 Protein: 105-115 grams Fluid: 1.9-2.1 L  Skin: ecchymosis on bilateral arms  Diet Order: Diet Heart  EDUCATION NEEDS: -Education needs addressed   Intake/Output Summary (Last 24 hours) at 11/21/13 1216 Last data filed at 11/21/13 0900  Gross per 24 hour  Intake    720 ml  Output      1 ml  Net    719 ml    Last BM: 11/20/13  Labs:   Recent Labs Lab 11/19/13 0255 11/20/13 0555 11/21/13 0524  NA 137 135* 137  K 3.6* 3.5* 3.5*  CL 103 101 105  CO2 22 22 22   BUN 7 7 6   CREATININE 0.55 0.58 0.57  CALCIUM 6.9* 7.4* 7.3*  GLUCOSE 109* 95 97    CBG (last 3)  No results for input(s): GLUCAP in the last 72 hours.  Scheduled Meds: . antiseptic oral rinse  7 mL Mouth Rinse BID  . cefTRIAXone (ROCEPHIN)  IV  1 g Intravenous Q24H  . citalopram  20 mg Oral QHS  . dicyclomine  20 mg Oral Daily  . [START ON 11/22/2013] furosemide  20 mg Oral Daily  . labetalol  50 mg Oral BID  . lactulose  30 g Oral TID PC &  HS  . multivitamin with minerals  1 tablet Oral Daily  . nicotine  14 mg Transdermal Daily  . ondansetron (ZOFRAN) IV  4 mg Intravenous 4 times per day  . pantoprazole  40 mg Oral BID AC  . rifaximin  550 mg Oral BID  . spironolactone  25 mg Oral Daily  . thiamine  100 mg Oral Daily    Continuous Infusions:   Past Medical History  Diagnosis Date  . Cirrhosis   . Hypertension   . Anxiety   . Hepatitis C   . Tobacco abuse 11/17/2013    Past Surgical History  Procedure Laterality Date  . Shoulder surgery    . Humerus fracture surgery    . Esophagogastroduodenoscopy N/A 11/18/2013    Procedure: ESOPHAGOGASTRODUODENOSCOPY (EGD);  Surgeon: Rachael Feeaniel P Jacobs, MD;  [facility];  Service: Endoscopy;  Laterality: N/A;    Dimonique Bourdeau A. Mayford KnifeWilliams,  RD, LDN Pager: [phone] After hours Pager: [phone]

## 2013-11-21 NOTE — Plan of Care (Signed)
Problem: Discharge Progression Outcomes Goal: Activity appropriate for discharge plan Outcome: Completed/Met Date Met:  11/21/13     

## 2013-11-21 NOTE — Evaluation (Signed)
Occupational Therapy Evaluation and Discharge Patient Details Name: Cynthia Mathews MRN: [id number] DOB: 06/19/1955 Today's Date: 11/21/2013    History of Present Illness Admitted on 11/16/2013 with bloody emesis x6. Gastroenterology feels it is likely from portal gastropathy. She just completed a course of Harvoni for Hep c ([facility] PMHx: HTN and anxiety   Clinical Impression   This 58 yo female admitted with above presents to acute OT with above presents to acute OT at a Mod I level overall (has to take increased time due to feeling weak). No further OT needs, we will sign off.    Follow Up Recommendations  No OT follow up    Equipment Recommendations  3 in 1 bedside comode       Precautions / Restrictions Precautions Precautions: Fall Restrictions Weight Bearing Restrictions: No      Mobility Bed Mobility Overal bed mobility: Independent                Transfers Overall transfer level: Modified independent               General transfer comment: increased time    Balance Overall balance assessment: Needs assistance Sitting-balance support: No upper extremity supported;Feet supported Sitting balance-Leahy Scale: Good     Standing balance support: No upper extremity supported Standing balance-Leahy Scale: Fair                              ADL Overall ADL's : Modified independent                                       General ADL Comments: Pt currently at a Mod I for all BADLs needing increases time to complete due to weakness. I recommended that she either get a seat for her tub or take a tub bath so she does not get so tired just bathing and increase her risk of falling due to weakness--she verbalized understanding. Pt would benefit from a BSC just to use at night since she is having to make so many trips to the bathroom               Pertinent Vitals/Pain Pain Assessment: No/denies pain      Hand Dominance Right   Extremity/Trunk Assessment Upper Extremity Assessment Upper Extremity Assessment: Overall WFL for tasks assessed   Lower Extremity Assessment Lower Extremity Assessment: Defer to PT evaluation       Communication Communication Communication: No difficulties   Cognition Arousal/Alertness: Awake/alert Behavior During Therapy: WFL for tasks assessed/performed Overall Cognitive Status: Within Functional Limits for tasks assessed                                Home Living Family/patient expects to be discharged to:: Private residence Living Arrangements: Alone Available Help at Discharge: Friend(s);Available PRN/intermittently Type of Home: House Home Access: Stairs to enter [employer] CorporationEntrance Stairs-Number of Steps: 5 Entrance Stairs-Rails: Left Home Layout: One level     Bathroom Shower/Tub: Tub/shower unit;Curtain Shower/tub characteristics: Engineer, building servicesCurtain Bathroom Toilet: Standard     Home Equipment: None          Prior Functioning/Environment Level of Independence: Independent        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    OT Diagnosis: Generalized weakness  OT Goals(Current goals can be found in the care plan section) Acute Rehab OT Goals Patient Stated Goal: home today hopefully  OT Frequency:                End of Session Equipment Utilized During Treatment: Gait belt  Activity Tolerance:  ("my legs just feel so weak") Patient left: in bed;with call bell/phone within reach   Time: 1002-1018 OT Time Calculation (min): 16 min Charges:  OT General Charges $OT Visit: 1 Procedure OT Evaluation $Initial OT Evaluation Tier I: 1 Procedure OT Treatments $Self Care/Home Management : 8-22 mins   Evette GeorgesLeonard, Vanette Noguchi Eva [phone] 11/21/2013, 10:33 AM

## 2013-11-21 NOTE — Progress Notes (Signed)
TEAM 1 - Stepdown/ICU TEAM Progress Note  Cynthia GravelKathy S Mathews ZOX:[id number] DOB: 03/01/1955 DOA: 11/16/2013 PCP: Cassell SmilesFUSCO,LAWRENCE J., MD  Admit HPI / Brief Narrative25: 58 y/o female with history of cirrhosis, Hep C, and HTN who presented 11/11 to [facility] for SOB, dizziness, palpitations and 6 episodes of hematemesis in 24 hours with a 6 month history of intermittent hematemesis. She is a pt at [facility] [place], and recently completed Harvoni tx for Hep C as well as an evaluation for hematemesis.   GI at [facility] admitted pt and work-up revealed decreased platelets with concern for a gastric variceal bleed.  [facility] and GI intervention after she [facility].   HPI/Subjective: Pt c/o unrelenting nausea today.  This has prevented her from being able to eat or drink anything today.  She has been able to keep her meds down thus far.  She feels "very weak" though PT/OT state she is safe for independent living.    Assessment/Plan:  Upper GI Bleed / Hematemesis Presumed to be due to portal gastropathy - s/p EGD w/ no clear varices but w/ moderate portal gastritis - cont oral PPI - compete 7 days of abx per GI rec - CBC stable   Intractable nausea w/ vomiting  This is limiting her intake to the point of making it unsafe for her to d/c home - I suspect it is due to her persisting high ammonia - increase lactulose to QID and follow - will also check C diff PCR as watery diarrrhea (though clearly lactulose related) could also be due to C diff in this pt who is on abx - may also be related to climbing Tbili - hydrate and recheck in AM  Gross hematuria likely simply due to coagulopathy - no hx to suggests stones - no recent instrumentation - no sx of obstruction - UA w/o evidence of infection - slowly appears to be improving - follow   Acute blood loss anemia  Due to above - Hgb stable - follow    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 C [facility] at [facility] - completed 24 weeks Harvoni "2 weeks ago" - HCV RNA not detectable 07/2013 nor on 10/12/2013 - [facility]   Thrombocytopenia Transfused a pheresis unit of plts - plts appear to have stabilized   Coagulopathy Transfused FFP + a unit of plts - dosed w/ Vitamin K - had little effect on coags - no further tx planned unless bleeding recurs   Tobacco abuse Counseled on absolute need to quit  Tachycardia - ?transient afib likely due to BB withdrawal - resolved with resumption of home BB dose   Hypotension Resolved   Code Status: FULL Family Communication:  Spoke w/ pt and her sister at bedside   Disposition Plan: D/C home when able tolerate oral intake / ammonia improved   Consultants: GI  Procedures: EGD - 11/13 - no blood present - no signif varices - portal gastropathy   Antibiotics: Levaquin 11/11 Rocephin 11/12 >11/17  DVT prophylaxis: SCDs  Objective: Blood pressure 103/56, pulse 86, temperature 98.3 F (36.8 C), temperature source Oral, resp. rate 18, height 5\' 1"  (1.549 m), weight 87.7 kg (193 lb 5.5 oz), SpO2 98 %.  Intake/Output Summary (Last 24 hours) at 11/21/13 1703 Last data filed at 11/21/13 1300  Gross per 24 hour  Intake    960 ml  Output  2 ml  Net    958 ml   Exam: General: No acute respiratory distress Lungs: Clear to auscultation bilaterally without wheezes or crackles Cardiovascular: RRR - without murmur gallop or rub Abdomen: Nontender, nondistended, soft, bowel sounds positive, no rebound, no ascites, no appreciable mass Extremities: No significant cyanosis, clubbing;  trace edema bilateral lower extremities  Data Reviewed: Basic Metabolic Panel:  Recent Labs Lab 11/17/13 0606 11/18/13 0225 11/19/13 0255 11/20/13 0555 11/21/13 0524  NA 138 136* 137 135* 137  K 4.0 3.9 3.6* 3.5* 3.5*  CL 104 101 103 101 105  CO2 23 24 22 22 22   GLUCOSE 88 127* 109* 95 97  BUN 8 9 7 7 6   CREATININE 0.60 0.64  0.55 0.58 0.57  CALCIUM 7.3* 6.9* 6.9* 7.4* 7.3*    Liver Function Tests:  Recent Labs Lab 11/17/13 0606 11/18/13 0225 11/19/13 0255 11/20/13 0555 11/21/13 0524  AST 122* 93* 88* 112* 111*  ALT 35 29 27 34 37*  ALKPHOS 78 64 64 64 65  BILITOT 4.7* 5.0* 5.8* 8.9* 9.0*  PROT 6.7 6.2 6.5 6.7 6.3  ALBUMIN 2.1* 2.1* 2.0* 2.1* 2.0*    Recent Labs Lab 11/16/13 1924 11/18/13 0225 11/19/13 0255 11/20/13 0555 11/21/13 0524  AMMONIA 45 70* 67* 61* 62*    Coags:  Recent Labs Lab 11/16/13 2054 11/17/13 0606 11/18/13 0225 11/19/13 0255 11/20/13 0555  INR 1.71* 1.99* 2.02* 2.00* 2.24*    Recent Labs Lab 11/17/13 0606 11/19/13 0255  APTT 43* 40*    CBC:  Recent Labs Lab 11/16/13 1836  11/18/13 0225 11/18/13 0704 11/19/13 0255 11/20/13 0555 11/21/13 0524  WBC 2.8*  < > 2.6* 2.5* 2.9* 3.7* 3.8*  NEUTROABS 1.6*  --   --   --   --   --   --   HGB 12.6  < > 9.8* 10.1* 10.4* 10.8* 10.2*  HCT 35.3*  < > 28.2* 28.9* 30.3* 31.0* 29.4*  MCV 103.8*  < > 105.6* 106.3* 109.4* 108.4* 107.7*  PLT 43*  < > 47* 42* 43* 40* 37*  < > = values in this interval not displayed. CBG:  Recent Labs Lab 11/17/13 1312  GLUCAP 124*    Recent Results (from the past 240 hour(s))  MRSA PCR Screening     Status: None   Collection Time: 11/16/13 10:37 PM  Result Value Ref Range Status   MRSA by PCR NEGATIVE NEGATIVE Final    Comment:        The GeneXpert MRSA Assay (FDA approved for NASAL specimens only), is one component of a comprehensive MRSA colonization surveillance program. It is not intended to diagnose MRSA infection nor to guide or monitor treatment for MRSA infections.   Culture, Urine     Status: None   Collection Time: 11/18/13  9:21 PM  Result Value Ref Range Status   Specimen Description URINE, RANDOM  Final   Special Requests NONE  Final   Culture  Setup Time   Final    11/19/2013 01:37 Performed at [facility]    Colony Count NO  GROWTH Performed at [facility]   Final   Culture NO GROWTH Performed at [facility]   Final   Report Status 11/20/2013 FINAL  Final     Studies:  Recent x-ray studies have been reviewed in detail by the Attending Physician  Scheduled Meds:  Scheduled Meds: . antiseptic oral rinse  7 mL Mouth Rinse BID  . cefTRIAXone (ROCEPHIN)  IV  1 g Intravenous Q24H  . citalopram  20 mg Oral QHS  . dicyclomine  20 mg Oral Daily  . feeding supplement (RESOURCE BREEZE)  1 Container Oral BID BM  . [START ON 11/22/2013] furosemide  20 mg Oral Daily  . labetalol  50 mg Oral BID  . lactulose  30 g Oral TID PC & HS  . multivitamin with minerals  1 tablet Oral Daily  . nicotine  14 mg Transdermal Daily  . ondansetron (ZOFRAN) IV  4 mg Intravenous 4 times per day  . pantoprazole  40 mg Oral BID AC  . rifaximin  550 mg Oral BID  . spironolactone  25 mg Oral Daily  . thiamine  100 mg Oral Daily    Time spent on care of this patient: 35 mins   MCCLUNG,JEFFREY T , MD   [facility] Office  [phone] Pager - Text Page per Loretha StaplerAmion as per below:  On-Call/Text Page:      [email or web address]      password [email or web address]  If 7PM-7AM, please contact night-coverage [email or web address] Password [email or web address] 11/21/2013, 5:03 PM   LOS: 5 days

## 2013-11-22 LAB — BASIC METABOLIC PANEL
ANION GAP: 12 (ref 5–15)
BUN: 8 mg/dL (ref 6–23)
CO2: 21 meq/L (ref 19–32)
Calcium: 7.3 mg/dL — ABNORMAL LOW (ref 8.4–10.5)
Chloride: 104 mEq/L (ref 96–112)
Creatinine, Ser: 0.59 mg/dL (ref 0.50–1.10)
GFR calc Af Amer: >90 mL/min (ref >90)
GFR calc non Af Amer: >90 mL/min (ref >90)
Glucose, Bld: 90 mg/dL (ref 70–99)
Potassium: 3 mEq/L — ABNORMAL LOW (ref 3.7–5.3)
SODIUM: 137 meq/L (ref 137–147)

## 2013-11-22 LAB — CBC
HCT: 29.4 % — ABNORMAL LOW (ref 36.0–46.0)
Hemoglobin: 10.1 g/dL — ABNORMAL LOW (ref 12.0–15.0)
MCH: 35.6 pg — ABNORMAL HIGH (ref 26.0–34.0)
MCHC: 34.4 g/dL (ref 30.0–36.0)
MCV: 103.5 fL — ABNORMAL HIGH (ref 78.0–100.0)
PLATELETS: 36 10*3/uL — AB (ref 150–400)
RBC: 2.84 MIL/uL — AB (ref 3.87–5.11)
RDW: 16 % — ABNORMAL HIGH (ref 11.5–15.5)
WBC: 4 10*3/uL (ref 4.0–10.5)

## 2013-11-22 LAB — HEPATIC FUNCTION PANEL
ALT: 39 U/L — AB (ref 0–35)
AST: 106 U/L — ABNORMAL HIGH (ref 0–37)
Albumin: 2 g/dL — ABNORMAL LOW (ref 3.5–5.2)
Alkaline Phosphatase: 63 U/L (ref 39–117)
BILIRUBIN INDIRECT: 3.1 mg/dL — AB (ref 0.3–0.9)
Bilirubin, Direct: 6.3 mg/dL — ABNORMAL HIGH (ref 0.0–0.3)
TOTAL PROTEIN: 6.2 g/dL (ref 6.0–8.3)
Total Bilirubin: 9.4 mg/dL — ABNORMAL HIGH (ref 0.3–1.2)

## 2013-11-22 LAB — AMMONIA: Ammonia: 66 umol/L — ABNORMAL HIGH (ref 11–60)

## 2013-11-22 LAB — HAPTOGLOBIN: HAPTOGLOBIN: 25 mg/dL — AB (ref 45–215)

## 2013-11-22 LAB — CLOSTRIDIUM DIFFICILE BY PCR: Toxigenic C. Difficile by PCR: NEGATIVE

## 2013-11-22 MED ORDER — POTASSIUM CHLORIDE 10 MEQ/100ML IV SOLN
10.0000 meq | INTRAVENOUS | Status: AC
  Administered 2013-11-22 (×3): 10 meq via INTRAVENOUS
  Filled 2013-11-22 (×3): qty 100

## 2013-11-22 MED ORDER — PROMETHAZINE HCL 25 MG/ML IJ SOLN
6.2500 mg | Freq: Four times a day (QID) | INTRAMUSCULAR | Status: DC | PRN
  Administered 2013-11-22: 6.25 mg via INTRAVENOUS
  Filled 2013-11-22: qty 1

## 2013-11-22 NOTE — Progress Notes (Addendum)
[facility] TEAM 1 - Stepdown/ICU TEAM Progress Note  Cynthia GravelKathy S Mathews [id number] DOB: 05/26/1955 DOA: 11/16/2013 PCP: Cassell SmilesFUSCO,LAWRENCE J., MD  Admit HPI / Brief Narrative69: 58 y/o female with history of cirrhosis, Hep C, and HTN who presented 11/11 to [facility] for SOB, dizziness, palpitations and 6 episodes of hematemesis in 24 hours with a 6 month history of intermittent hematemesis. She is a pt at [facility] [place], and recently completed Harvoni tx for Hep C as well as an evaluation for hematemesis.   GI at [facility] admitted pt and work-up revealed decreased platelets with concern for a gastric variceal bleed.  The pt [facility] medical management and GI intervention after [facility]   HPI/Subjective: Patient continues to complain of generalized weakness, she is concerned about going home today by herself. Continues to have poor appetite. Denies hematemesis, right rib blood per rectum or melena.   Assessment/Plan:  Upper GI Bleed / Hematemesis Presumed to be due to portal gastropathy - s/p EGD w/ no clear varices but w/ moderate portal gastritis - cont oral PPI - compete 7 days of abx per GI rec Patient's hemoglobin 10.1 on 11/22/2013. She denies further episodes of hematemesis  Hypokalemia Lab work showing potassium of 3.0, patient was administered IV potassium in a.m. Repeat labs in a.m.  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 Labs showing a platelet count of 36,000  Coagulopathy Transfused FFP + a  unit of plts - dosed w/ Vitamin K - had little effect on coags - no further tx planned unless bleeding recurs   Tobacco abuse Counseled on absolute need to quit  Tachycardia - ?transient afib likely due to BB withdrawal - resolved with resumption of home BB dose   Hypotension Resolved   Code Status: FULL Family Communication:  Spoke w/ signif other at bedside   Disposition Plan: Patient complains of ongoing weakness, likely deconditioned. She states living home alone stating having no family support. Will have her work with physical therapy today, anticipate discharge in the next 24 hours if remains stable. Face-to-face for home health services done.   Consultants: GI  Procedures: EGD - 11/13 - no blood present - no signif varices - portal gastropathy   Antibiotics: Levaquin 11/11 Rocephin 11/12 >  DVT prophylaxis: SCDs  Objective: Blood pressure 121/61, pulse 86, temperature 98.1 F (36.7 C), temperature source Oral, resp. rate 16, height 5\' 1"  (1.549 m), weight 87.7 kg (193 lb 5.5 oz), SpO2 97 %.  Intake/Output Summary (Last 24 hours) at 11/22/13 1719 Last data filed at 11/22/13 1423  Gross per 24 hour  Intake   1100 ml  Output      3 ml  Net   1097 ml   Exam: General: No acute respiratory distress Lungs: Clear to auscultation bilaterally without wheezes/crackles Cardiovascular: RRR - without murmur gallop or rub Abdomen: Nontender, nondistended, soft, bowel sounds positive, no rebound, no ascites, no appreciable mass Extremities: No  significant cyanosis, clubbing;  trace edema bilateral lower extremities  Data Reviewed: Basic Metabolic Panel:  Recent Labs Lab 11/18/13 0225 11/19/13 0255 11/20/13 0555 11/21/13 0524 11/22/13 0503  NA 136* 137 135* 137 137  K 3.9 3.6* 3.5* 3.5* 3.0*  CL 101 103 101 105 104  CO2 24 22 22 22 21   GLUCOSE 127* 109* 95 97 90  BUN 9 7 7 6 8   CREATININE 0.64 0.55 0.58 0.57 0.59  CALCIUM 6.9* 6.9* 7.4* 7.3* 7.3*    Liver  Function Tests:  Recent Labs Lab 11/18/13 0225 11/19/13 0255 11/20/13 0555 11/21/13 0524 11/22/13 0503  AST 93* 88* 112* 111* 106*  ALT 29 27 34 37* 39*  ALKPHOS 64 64 64 65 63  BILITOT 5.0* 5.8* 8.9* 9.0* 9.4*  PROT 6.2 6.5 6.7 6.3 6.2  ALBUMIN 2.1* 2.0* 2.1* 2.0* 2.0*    Recent Labs Lab 11/18/13 0225 11/19/13 0255 11/20/13 0555 11/21/13 0524 11/22/13 0503  AMMONIA 70* 67* 61* 62* 66*    Coags:  Recent Labs Lab 11/16/13 2054 11/17/13 0606 11/18/13 0225 11/19/13 0255 11/20/13 0555  INR 1.71* 1.99* 2.02* 2.00* 2.24*    Recent Labs Lab 11/17/13 0606 11/19/13 0255  APTT 43* 40*    CBC:  Recent Labs Lab 11/16/13 1836  11/18/13 0704 11/19/13 0255 11/20/13 0555 11/21/13 0524 11/22/13 0503  WBC 2.8*  < > 2.5* 2.9* 3.7* 3.8* 4.0  NEUTROABS 1.6*  --   --   --   --   --   --   HGB 12.6  < > 10.1* 10.4* 10.8* 10.2* 10.1*  HCT 35.3*  < > 28.9* 30.3* 31.0* 29.4* 29.4*  MCV 103.8*  < > 106.3* 109.4* 108.4* 107.7* 103.5*  PLT 43*  < > 42* 43* 40* 37* 36*  < > = values in this interval not displayed. CBG:  Recent Labs Lab 11/17/13 1312  GLUCAP 124*    Recent Results (from the past 240 hour(s))  MRSA PCR Screening     Status: None   Collection Time: 11/16/13 10:37 PM  Result Value Ref Range Status   MRSA by PCR NEGATIVE NEGATIVE Final    Comment:        The GeneXpert MRSA Assay (FDA approved for NASAL specimens only), is one component of a comprehensive MRSA colonization surveillance program. It is not intended to diagnose MRSA infection nor to guide or monitor treatment for MRSA infections.   Culture, Urine     Status: None   Collection Time: 11/18/13  9:21 PM  Result Value Ref Range Status   Specimen Description URINE, RANDOM  Final   Special Requests NONE  Final   Culture  Setup Time   Final    11/19/2013 01:37 Performed at [facility]    Colony Count NO GROWTH Performed at [facility]   Final   Culture NO  GROWTH Performed at [facility]   Final   Report Status 11/20/2013 FINAL  Final  Clostridium Difficile by PCR     Status: None   Collection Time: 11/21/13  6:01 PM  Result Value Ref Range Status   C difficile by pcr NEGATIVE NEGATIVE Final     Studies:  Recent x-ray studies have been reviewed in detail by the Attending Physician  Scheduled Meds:  Scheduled Meds: . antiseptic oral rinse  7 mL Mouth Rinse BID  . citalopram  20 mg Oral QHS  . dicyclomine  20 mg Oral Daily  . feeding supplement (RESOURCE  BREEZE)  1 Container Oral BID BM  . labetalol  50 mg Oral BID  . lactulose  30 g Oral TID PC & HS  . multivitamin with minerals  1 tablet Oral Daily  . nicotine  14 mg Transdermal Daily  . ondansetron (ZOFRAN) IV  4 mg Intravenous 4 times per day  . pantoprazole  40 mg Oral BID AC  . rifaximin  550 mg Oral BID  . spironolactone  25 mg Oral Daily  . thiamine  100 mg Oral Daily    Time spent on care of this patient: 20 mins   Jeralyn Bennett , MD

## 2013-11-22 NOTE — Plan of Care (Signed)
Problem: Phase I Progression Outcomes Goal: OOB as tolerated unless otherwise ordered Outcome: Completed/Met Date Met:  11/22/13 Goal: Hemodynamically stable Outcome: Completed/Met Date Met:  11/22/13  Problem: Phase II Progression Outcomes Goal: Progress activity as tolerated unless otherwise ordered Outcome: Completed/Met Date Met:  11/22/13  Problem: Phase III Progression Outcomes Goal: Activity at appropriate level-compared to baseline (UP IN CHAIR FOR HEMODIALYSIS)  Outcome: Completed/Met Date Met:  11/22/13

## 2013-11-23 LAB — BASIC METABOLIC PANEL
ANION GAP: 12 (ref 5–15)
BUN: 9 mg/dL (ref 6–23)
CHLORIDE: 107 meq/L (ref 96–112)
CO2: 19 meq/L (ref 19–32)
Calcium: 7.4 mg/dL — ABNORMAL LOW (ref 8.4–10.5)
Creatinine, Ser: 0.62 mg/dL (ref 0.50–1.10)
GFR calc Af Amer: >90 mL/min (ref >90)
GFR calc non Af Amer: >90 mL/min (ref >90)
Glucose, Bld: 92 mg/dL (ref 70–99)
Potassium: 3.6 mEq/L — ABNORMAL LOW (ref 3.7–5.3)
SODIUM: 138 meq/L (ref 137–147)

## 2013-11-23 LAB — CBC
HEMATOCRIT: 29.2 % — AB (ref 36.0–46.0)
HEMOGLOBIN: 10.1 g/dL — AB (ref 12.0–15.0)
MCH: 36.1 pg — ABNORMAL HIGH (ref 26.0–34.0)
MCHC: 34.6 g/dL (ref 30.0–36.0)
MCV: 104.3 fL — ABNORMAL HIGH (ref 78.0–100.0)
Platelets: 40 10*3/uL — ABNORMAL LOW (ref 150–400)
RBC: 2.8 MIL/uL — ABNORMAL LOW (ref 3.87–5.11)
RDW: 16.3 % — ABNORMAL HIGH (ref 11.5–15.5)
WBC: 3.9 10*3/uL — ABNORMAL LOW (ref 4.0–10.5)

## 2013-11-23 MED ORDER — POTASSIUM CHLORIDE 20 MEQ/15ML (10%) PO SOLN: 10.0000 meq | Freq: Once | ORAL | Status: DC

## 2013-11-23 MED ORDER — LACTULOSE 10 GM/15ML PO SOLN: 30.0000 g | Freq: Four times a day (QID) | ORAL | Status: DC

## 2013-11-23 MED ORDER — NICOTINE 14 MG/24HR TD PT24: 14.0000 mg | MEDICATED_PATCH | Freq: Every day | TRANSDERMAL | Status: AC

## 2013-11-23 MED ORDER — POTASSIUM CHLORIDE 10 MEQ/100ML IV SOLN
10.0000 meq | INTRAVENOUS | Status: AC
  Administered 2013-11-23: 10 meq via INTRAVENOUS

## 2013-11-23 MED ORDER — PANTOPRAZOLE SODIUM 40 MG PO TBEC: 40.0000 mg | DELAYED_RELEASE_TABLET | Freq: Two times a day (BID) | ORAL | Status: DC

## 2013-11-23 MED ORDER — POTASSIUM PHOSPHATES 15 MMOLE/5ML IV SOLN: 10.0000 meq | Freq: Once | INTRAVENOUS | Status: DC

## 2013-11-23 MED ORDER — PANTOPRAZOLE SODIUM 40 MG PO TBEC: 40.0000 mg | DELAYED_RELEASE_TABLET | Freq: Two times a day (BID) | ORAL | Status: AC

## 2013-11-23 MED ORDER — POTASSIUM CHLORIDE CRYS ER 20 MEQ PO TBCR: 40.0000 meq | EXTENDED_RELEASE_TABLET | Freq: Once | ORAL | Status: DC

## 2013-11-23 MED ORDER — ONDANSETRON 4 MG PO TBDP: 4.0000 mg | ORAL_TABLET | Freq: Three times a day (TID) | ORAL | Status: DC | PRN

## 2013-11-23 MED ORDER — NICOTINE 14 MG/24HR TD PT24: 14.0000 mg | MEDICATED_PATCH | Freq: Every day | TRANSDERMAL | Status: DC

## 2013-11-23 MED ORDER — CIPROFLOXACIN HCL 500 MG PO TABS: 500.0000 mg | ORAL_TABLET | Freq: Two times a day (BID) | ORAL | Status: DC

## 2013-11-23 MED ORDER — ONDANSETRON 4 MG PO TBDP: 4.0000 mg | ORAL_TABLET | Freq: Three times a day (TID) | ORAL | Status: AC | PRN

## 2013-11-23 MED ORDER — LACTULOSE 10 GM/15ML PO SOLN: 30.0000 g | Freq: Four times a day (QID) | ORAL | Status: AC

## 2013-11-23 NOTE — Progress Notes (Signed)
Discharge instructions gone over with patient. Home medications gone over. Follow up appointments to be made. Prescriptions given. Patient told to complete antibiotic therapy. Diet, activity, and smoking cessation discussed. Patient verbalized understanding of instructions.

## 2013-11-23 NOTE — Discharge Summary (Signed)
Physician Discharge Summary  Patient ID: Cynthia Mathews MRN: [id number] DOB/AGE: 57/01/1955 57 y.o.  Admit date: 11/16/2013 Discharge date: 11/23/2013  Primary Care Physician:  Cassell SmilesFUSCO,LAWRENCE J., MD  Discharge Diagnoses:     . Upper GI bleed likely due to portal gastropathy . Hematemesis . Hepatic cirrhosis due to chronic hepatitis C infection . Hypertension . Anxiety . Tachycardia . Neutropenia . Acute blood loss anemia . (Resolved) Hepatitis C . Tobacco abuse . Thrombocytopenia . Coagulopathy  Consults:  gastroenterology   Recommendations for Outpatient Follow-up:  Patient was [facility], Dr. Jacqualine MauZacks  continue PPI Please check CBC at the time of follow-up appointment  Allergies:   Allergies  Allergen Reactions  . Codeine Nausea Only     Discharge Medications:   Medication List    TAKE these medications        ALPRAZolam 0.5 MG tablet  Commonly known as:  XANAX  Take 0.5 mg by mouth daily as needed. FOR ANXIETY     amLODipine 10 MG tablet  Commonly known as:  NORVASC  Take 10 mg by mouth daily.     ciprofloxacin 500 MG tablet  Commonly known as:  CIPRO  Take 1 tablet (500 mg total) by mouth 2 (two) times daily. X 4 days     citalopram 20 MG tablet  Commonly known as:  CELEXA  Take 20 mg by mouth at bedtime.     dicyclomine 20 MG tablet  Commonly known as:  BENTYL  Take 20 mg by mouth daily.     furosemide 40 MG tablet  Commonly known as:  LASIX  Take 40 mg by mouth daily.     HARVONI 90-400 MG Tabs  Generic drug:  Ledipasvir-Sofosbuvir  Take 1 tablet by mouth daily.     HYDROcodone-acetaminophen 5-325 MG per tablet  Commonly known as:  NORCO/VICODIN  Take 1-2 tabs by mouth every 6 hours when necessary pain.     labetalol 100 MG tablet  Commonly known as:  NORMODYNE  Take 50 mg by mouth 2 (two) times daily.     lactulose 10 GM/15ML solution  Commonly known as:  CHRONULAC  Take 45 mLs (30 g total)  by mouth 4 (four) times daily. After meals and bed time for atleast 2-3 BM's/day     LORazepam 0.5 MG tablet  Commonly known as:  ATIVAN  Take 0.5 mg by mouth every 8 (eight) hours as needed. For anxiety.     multivitamin with minerals Tabs tablet  Take 1 tablet by mouth daily.     nicotine 14 mg/24hr patch  Commonly known as:  NICODERM CQ - dosed in mg/24 hours  Place 1 patch (14 mg total) onto the skin daily.     ondansetron 4 MG disintegrating tablet  Commonly known as:  ZOFRAN ODT  Take 1 tablet (4 mg total) by mouth every 8 (eight) hours as needed for nausea or vomiting.     pantoprazole 40 MG tablet  Commonly known as:  PROTONIX  Take 1 tablet (40 mg total) by mouth 2 (two) times daily before a meal.     rifaximin 550 MG Tabs tablet  Commonly known as:  XIFAXAN  Take 550 mg by mouth 2 (two) times daily.     spironolactone 25 MG tablet  Commonly known as:  ALDACTONE  Take 25 mg by mouth daily.     VITAMIN B-1 PO  Take 1 tablet by mouth daily.  Brief H and P: For complete details please refer to admission H and P, but in brief patient is a 58 year old female who presented with 6 episodes of bloody emesis at the time of admission. After last episode, she started to feel dizzy, palpitations and shortness of breath.  Hospital Course:     Upper GI bleed with acute blood loss anemia Patient presented with shortness of breath, dizziness, palpitation and 6 episodes of hematemesis in 24 hours prior to admission. Patient also had 6 monthshistory of intermittent hematemesis.patient is followed [facility] and had recently completed Harvoni treatment for hepatitis C. [facility] was [facility] at [facility], workup revealed decreased platelets with concern for a gastric variceal bleed. [facility] medical [facility] and GI intervention after [facility]. Patient underwent  gastroenterology which showed no clear masses or active bleeding however showed moderate portal gastritis. Patient was recommended oral PPI and continue total of 7 days of antibiotics. Hemoglobin has remained stable.    Hepatic cirrhosis due to chronic hepatitis C infection Patient has completed Harvoni treatment, [facility]  Gross hematuria Improved, most likely due to colopathy, no history to suggest renal stones, no recent instrumentation. Patient recommended to follow-up with primary care physician or urology if symptoms restart.   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 The patient was transfused a pheresis unit of platelets, appears to have stabilized, although did not respond to significant degree of platelet improvement after transfusion  Tobacco abuse Patient was counseled to quit smoking and prescription was given for nicotine patch  Tachycardia; likely related to beta blocker withdrawal, resolved with resumption of home dose of amlodipine   Day of Discharge BP 121/51 mmHg  Pulse 86  Temp(Src) 98.2 F (36.8 C) (Oral)  Resp 20  Ht 5\' 1"  (1.549 m)  Wt 87.7 kg (193 lb 5.5 oz)  BMI 36.55 kg/m2  SpO2 93%  Physical Exam: General: Alert and awake oriented x3 not in any acute distress. CVS: S1-S2 clear no murmur rubs or gallops Chest: clear to auscultation bilaterally, no wheezing rales or rhonchi Abdomen: soft nontender, nondistended, normal bowel sounds Extremities: no cyanosis, clubbing or edema noted bilaterally Neuro: Cranial nerves II-XII intact, no focal neurological deficits   The results of significant diagnostics from this hospitalization (including imaging, microbiology, ancillary and laboratory) are listed below for reference.    LAB RESULTS: Basic Metabolic Panel:  Recent Labs Lab 11/22/13 0503 11/23/13 0524  NA 137 138  K 3.0* 3.6*  CL 104 107  CO2 21 19  GLUCOSE 90 92  BUN 8 9  CREATININE 0.59  0.62  CALCIUM 7.3* 7.4*   Liver Function Tests:  Recent Labs Lab 11/21/13 0524 11/22/13 0503  AST 111* 106*  ALT 37* 39*  ALKPHOS 65 63  BILITOT 9.0* 9.4*  PROT 6.3 6.2  ALBUMIN 2.0* 2.0*   No results for input(s): LIPASE, AMYLASE in the last 168 hours.  Recent Labs Lab 11/21/13 0524 11/22/13 0503  AMMONIA 62* 66*   CBC:  Recent Labs Lab 11/16/13 1836  11/22/13 0503 11/23/13 0524  WBC 2.8*  < > 4.0 3.9*  NEUTROABS 1.6*  --   --   --   HGB 12.6  < > 10.1* 10.1*  HCT 35.3*  < > 29.4* 29.2*  MCV 103.8*  < > 103.5* 104.3*  PLT 43*  < > 36* 40*  < > =  values in this interval not displayed. Cardiac Enzymes: No results for input(s): CKTOTAL, CKMB, CKMBINDEX, TROPONINI in the last 168 hours. BNP: Invalid input(s): POCBNP CBG:  Recent Labs Lab 11/17/13 1312  GLUCAP 124*    Significant Diagnostic Studies:  Dg Chest Port 1 View  11/16/2013   CLINICAL DATA:  Vomiting blood. Shortness of breath today. History of cirrhosis.  EXAM: PORTABLE CHEST - 1 VIEW  COMPARISON:  06/04/2010  FINDINGS: Heart and mediastinal contours are within normal limits. Scarring at the left lung base. Mild peribronchial thickening and prominent interstitial markings, similar prior study. No confluent opacities or effusions. No acute bony abnormality.  IMPRESSION: Mild peribronchial thickening and interstitial prominence, similar to prior study. This may reflect mild chronic bronchitis.  Lingular scarring.   Electronically Signed   By: Charlett Nose M.D.   On: 11/16/2013 19:22       Disposition and Follow-up:     Discharge Instructions    Diet - low sodium heart healthy    Complete by:  As directed      Increase activity slowly    Complete by:  As directed             DISPOSITION: home  DIET:heart healthy diet   TESTS THAT NEED FOLLOW-UP CBC  DISCHARGE FOLLOW-UP Follow-up Information    Follow up with ZACKS,STEVEN L, MD On 01/11/2014.   Specialty:  Gastroenterology   Why:   for hospital follow-up   Contact information:   [address] [facility] [place] [address] [phone]       Follow up with Cassell Smiles., MD. Schedule an appointment as soon as possible for a visit in 10 days.   Specialty:  Internal Medicine   Why:  for hospital follow-up,, obtain labs for hemoglobin   Contact information:   [address] [place] [address] [phone]       Time spent on Discharge: 38 minutes  Signed:   Elene Downum M.D. [facility] 11/23/2013, 12:07 PM Pager: [phone]

## 2013-11-23 NOTE — Plan of Care (Signed)
Problem: Discharge Progression Outcomes Goal: Discharge plan in place and appropriate Outcome: Completed/Met Date Met:  11/23/13 Goal: Pain controlled with appropriate interventions Outcome: Completed/Met Date Met:  11/23/13 Goal: Hemodynamically stable Outcome: Completed/Met Date Met:  11/23/13 Goal: Tolerating diet Outcome: Completed/Met Date Met:  11/23/13 Goal: [place] arrangements in place Outcome: Not Applicable Date Met:  57/90/38

## 2013-11-23 NOTE — Plan of Care (Signed)
Problem: Discharge Progression Outcomes Goal: Stools guaiac negative Outcome: Not Applicable Date Met:  70/17/79

## 2013-11-23 NOTE — Plan of Care (Signed)
Problem: Phase III Progression Outcomes Goal: H&H stablized <1gm drop in 24 hrs, no active bleeding Outcome: Completed/Met Date Met:  11/23/13

## 2013-12-06 ENCOUNTER — Inpatient Hospital Stay (HOSPITAL_COMMUNITY): Payer: Medicaid Other

## 2013-12-06 ENCOUNTER — Inpatient Hospital Stay (HOSPITAL_COMMUNITY)
Admission: EM | Admit: 2013-12-06 | Discharge: 2013-12-09 | DRG: 871 | Disposition: A | Discharge status: Home / Self Care | Payer: Medicaid Other | Attending: Internal Medicine | Admitting: Internal Medicine

## 2013-12-06 ENCOUNTER — Encounter (HOSPITAL_COMMUNITY): Payer: Self-pay | Admitting: *Deleted

## 2013-12-06 DIAGNOSIS — K3189 Other diseases of stomach and duodenum: Secondary | ICD-10-CM | POA: Diagnosis present

## 2013-12-06 DIAGNOSIS — F17210 Nicotine dependence, cigarettes, uncomplicated: Secondary | ICD-10-CM | POA: Diagnosis present

## 2013-12-06 DIAGNOSIS — E861 Hypovolemia: Secondary | ICD-10-CM | POA: Diagnosis present

## 2013-12-06 DIAGNOSIS — E8770 Fluid overload, unspecified: Secondary | ICD-10-CM | POA: Diagnosis present

## 2013-12-06 DIAGNOSIS — D696 Thrombocytopenia, unspecified: Secondary | ICD-10-CM | POA: Diagnosis present

## 2013-12-06 DIAGNOSIS — E876 Hypokalemia: Secondary | ICD-10-CM | POA: Diagnosis not present

## 2013-12-06 DIAGNOSIS — R8271 Bacteriuria: Secondary | ICD-10-CM | POA: Diagnosis present

## 2013-12-06 DIAGNOSIS — Z7682 Awaiting organ transplant status: Secondary | ICD-10-CM | POA: Diagnosis not present

## 2013-12-06 DIAGNOSIS — D689 Coagulation defect, unspecified: Secondary | ICD-10-CM | POA: Diagnosis present

## 2013-12-06 DIAGNOSIS — E871 Hypo-osmolality and hyponatremia: Secondary | ICD-10-CM | POA: Diagnosis present

## 2013-12-06 DIAGNOSIS — R635 Abnormal weight gain: Secondary | ICD-10-CM | POA: Diagnosis present

## 2013-12-06 DIAGNOSIS — B182 Chronic viral hepatitis C: Secondary | ICD-10-CM | POA: Diagnosis present

## 2013-12-06 DIAGNOSIS — Z885 Allergy status to narcotic agent status: Secondary | ICD-10-CM | POA: Diagnosis not present

## 2013-12-06 DIAGNOSIS — R188 Other ascites: Secondary | ICD-10-CM

## 2013-12-06 DIAGNOSIS — A419 Sepsis, unspecified organism: Principal | ICD-10-CM

## 2013-12-06 DIAGNOSIS — I10 Essential (primary) hypertension: Secondary | ICD-10-CM | POA: Diagnosis present

## 2013-12-06 DIAGNOSIS — F419 Anxiety disorder, unspecified: Secondary | ICD-10-CM | POA: Diagnosis present

## 2013-12-06 DIAGNOSIS — Z79899 Other long term (current) drug therapy: Secondary | ICD-10-CM | POA: Diagnosis not present

## 2013-12-06 DIAGNOSIS — J9811 Atelectasis: Secondary | ICD-10-CM | POA: Diagnosis present

## 2013-12-06 DIAGNOSIS — Z720 Tobacco use: Secondary | ICD-10-CM

## 2013-12-06 DIAGNOSIS — K766 Portal hypertension: Secondary | ICD-10-CM | POA: Diagnosis present

## 2013-12-06 DIAGNOSIS — J189 Pneumonia, unspecified organism: Secondary | ICD-10-CM | POA: Diagnosis present

## 2013-12-06 DIAGNOSIS — R0602 Shortness of breath: Secondary | ICD-10-CM

## 2013-12-06 DIAGNOSIS — K7460 Unspecified cirrhosis of liver: Secondary | ICD-10-CM | POA: Diagnosis present

## 2013-12-06 DIAGNOSIS — R000 Tachycardia, unspecified: Secondary | ICD-10-CM | POA: Diagnosis present

## 2013-12-06 DIAGNOSIS — N390 Urinary tract infection, site not specified: Secondary | ICD-10-CM | POA: Diagnosis present

## 2013-12-06 DIAGNOSIS — R109 Unspecified abdominal pain: Secondary | ICD-10-CM | POA: Diagnosis not present

## 2013-12-06 HISTORY — DX: Thrombocytopenia, unspecified: D69.6

## 2013-12-06 HISTORY — DX: Anemia, unspecified: D64.9

## 2013-12-06 HISTORY — DX: Gastrointestinal hemorrhage, unspecified: K92.2

## 2013-12-06 HISTORY — DX: Unspecified cirrhosis of liver: K74.60

## 2013-12-06 HISTORY — DX: Reserved for inherently not codable concepts without codable children: IMO0001

## 2013-12-06 LAB — CBC WITH DIFFERENTIAL/PLATELET
Basophils Absolute: 0.1 10*3/uL (ref 0.0–0.1)
Basophils Relative: 1 % (ref 0–1)
EOS ABS: 0.1 10*3/uL (ref 0.0–0.7)
Eosinophils Relative: 1 % (ref 0–5)
HCT: 35 % — ABNORMAL LOW (ref 36.0–46.0)
Hemoglobin: 12.4 g/dL (ref 12.0–15.0)
LYMPHS PCT: 16 % (ref 12–46)
Lymphs Abs: 1.2 10*3/uL (ref 0.7–4.0)
MCH: 35.7 pg — AB (ref 26.0–34.0)
MCHC: 35.8 g/dL (ref 30.0–36.0)
MCV: 102.6 fL — ABNORMAL HIGH (ref 78.0–100.0)
MONOS PCT: 12 % (ref 3–12)
Monocytes Absolute: 0.9 10*3/uL (ref 0.1–1.0)
NEUTROS PCT: 70 % (ref 43–77)
Neutro Abs: 5.4 10*3/uL (ref 1.7–7.7)
Platelets: 65 10*3/uL — ABNORMAL LOW (ref 150–400)
RBC: 3.41 MIL/uL — ABNORMAL LOW (ref 3.87–5.11)
RDW: 16 % — ABNORMAL HIGH (ref 11.5–15.5)
WBC: 7.6 10*3/uL (ref 4.0–10.5)

## 2013-12-06 LAB — URINALYSIS, ROUTINE W REFLEX MICROSCOPIC
Glucose, UA: NEGATIVE mg/dL
Ketones, ur: 15 mg/dL — AB
NITRITE: POSITIVE — AB
PH: 6.5 (ref 5.0–8.0)
Protein, ur: 100 mg/dL — AB
Specific Gravity, Urine: >1.03 — ABNORMAL HIGH (ref 1.005–1.030)
UROBILINOGEN UA: 1 mg/dL (ref 0.0–1.0)

## 2013-12-06 LAB — COMPREHENSIVE METABOLIC PANEL
ALK PHOS: 93 U/L (ref 39–117)
ALT: 39 U/L — AB (ref 0–35)
AST: 135 U/L — ABNORMAL HIGH (ref 0–37)
Albumin: 1.8 g/dL — ABNORMAL LOW (ref 3.5–5.2)
Anion gap: 11 (ref 5–15)
BILIRUBIN TOTAL: 8.3 mg/dL — AB (ref 0.3–1.2)
BUN: 3 mg/dL — AB (ref 6–23)
CHLORIDE: 92 meq/L — AB (ref 96–112)
CO2: 25 meq/L (ref 19–32)
Calcium: 7.7 mg/dL — ABNORMAL LOW (ref 8.4–10.5)
Creatinine, Ser: 0.55 mg/dL (ref 0.50–1.10)
GFR calc non Af Amer: >90 mL/min (ref >90)
GLUCOSE: 129 mg/dL — AB (ref 70–99)
POTASSIUM: 3.4 meq/L — AB (ref 3.7–5.3)
Sodium: 128 mEq/L — ABNORMAL LOW (ref 137–147)
TOTAL PROTEIN: 7.3 g/dL (ref 6.0–8.3)

## 2013-12-06 LAB — URINE MICROSCOPIC-ADD ON

## 2013-12-06 LAB — I-STAT TROPONIN, ED: Troponin i, poc: 0.01 ng/mL (ref 0.00–0.08)

## 2013-12-06 LAB — D-DIMER, QUANTITATIVE (NOT AT ARMC): D DIMER QUANT: 3.76 ug{FEU}/mL — AB (ref 0.00–0.48)

## 2013-12-06 LAB — PROTIME-INR
INR: 2.35 — ABNORMAL HIGH (ref 0.00–1.49)
Prothrombin Time: 25.9 seconds — ABNORMAL HIGH (ref 11.6–15.2)

## 2013-12-06 LAB — LIPASE, BLOOD: Lipase: 70 U/L — ABNORMAL HIGH (ref 11–59)

## 2013-12-06 MED ORDER — LORAZEPAM 0.5 MG PO TABS: 0.5000 mg | ORAL_TABLET | Freq: Three times a day (TID) | ORAL | Status: DC | PRN

## 2013-12-06 MED ORDER — ONDANSETRON 4 MG PO TBDP
4.0000 mg | ORAL_TABLET | Freq: Three times a day (TID) | ORAL | Status: DC | PRN
Start: 2013-12-06 — End: 2013-12-09
  Administered 2013-12-06: 4 mg via ORAL
  Filled 2013-12-06 (×2): qty 1

## 2013-12-06 MED ORDER — RIFAXIMIN 550 MG PO TABS
550.0000 mg | ORAL_TABLET | Freq: Two times a day (BID) | ORAL | Status: DC
  Administered 2013-12-06 – 2013-12-09 (×6): 550 mg via ORAL
  Filled 2013-12-06 (×7): qty 1

## 2013-12-06 MED ORDER — CITALOPRAM HYDROBROMIDE 20 MG PO TABS
20.0000 mg | ORAL_TABLET | Freq: Every day | ORAL | Status: DC
  Administered 2013-12-06 – 2013-12-07 (×2): 20 mg via ORAL
  Filled 2013-12-06 (×4): qty 1

## 2013-12-06 MED ORDER — SODIUM CHLORIDE 0.9 % IJ SOLN
3.0000 mL | Freq: Two times a day (BID) | INTRAMUSCULAR | Status: DC
  Administered 2013-12-08 (×2): 3 mL via INTRAVENOUS

## 2013-12-06 MED ORDER — SODIUM CHLORIDE 0.9 % IJ SOLN: 3.0000 mL | INTRAMUSCULAR | Status: DC | PRN

## 2013-12-06 MED ORDER — DEXTROSE 5 % IV SOLN
1.0000 g | Freq: Once | INTRAVENOUS | Status: AC
  Administered 2013-12-06: 1 g via INTRAVENOUS
  Filled 2013-12-06: qty 10

## 2013-12-06 MED ORDER — LACTULOSE 10 GM/15ML PO SOLN
30.0000 g | Freq: Three times a day (TID) | ORAL | Status: DC
  Administered 2013-12-06 – 2013-12-09 (×4): 30 g via ORAL
  Filled 2013-12-06 (×15): qty 45

## 2013-12-06 MED ORDER — SODIUM CHLORIDE 0.9 % IV BOLUS (SEPSIS)
1000.0000 mL | Freq: Once | INTRAVENOUS | Status: AC
  Administered 2013-12-06: 1000 mL via INTRAVENOUS

## 2013-12-06 MED ORDER — PANTOPRAZOLE SODIUM 40 MG PO TBEC
40.0000 mg | DELAYED_RELEASE_TABLET | Freq: Two times a day (BID) | ORAL | Status: DC
  Administered 2013-12-06 – 2013-12-09 (×6): 40 mg via ORAL
  Filled 2013-12-06 (×4): qty 1

## 2013-12-06 MED ORDER — NICOTINE 14 MG/24HR TD PT24
14.0000 mg | MEDICATED_PATCH | Freq: Every day | TRANSDERMAL | Status: DC
  Filled 2013-12-06 (×4): qty 1

## 2013-12-06 MED ORDER — DEXTROSE 5 % IV SOLN
1.0000 g | INTRAVENOUS | Status: DC
  Administered 2013-12-07: 1 g via INTRAVENOUS
  Filled 2013-12-06: qty 10

## 2013-12-06 MED ORDER — LABETALOL HCL 100 MG PO TABS
50.0000 mg | ORAL_TABLET | Freq: Two times a day (BID) | ORAL | Status: DC
  Administered 2013-12-06: 50 mg via ORAL
  Filled 2013-12-06 (×3): qty 0.5

## 2013-12-06 MED ORDER — IOHEXOL 350 MG/ML SOLN
80.0000 mL | Freq: Once | INTRAVENOUS | Status: AC | PRN
  Administered 2013-12-06: 80 mL via INTRAVENOUS

## 2013-12-06 MED ORDER — ADULT MULTIVITAMIN W/MINERALS CH
1.0000 | ORAL_TABLET | Freq: Every day | ORAL | Status: DC
  Administered 2013-12-06 – 2013-12-09 (×4): 1 via ORAL
  Filled 2013-12-06 (×4): qty 1

## 2013-12-06 MED ORDER — SODIUM CHLORIDE 0.9 % IV BOLUS (SEPSIS)
500.0000 mL | Freq: Once | INTRAVENOUS | Status: AC
  Administered 2013-12-06: 500 mL via INTRAVENOUS

## 2013-12-06 MED ORDER — HYDROCODONE-ACETAMINOPHEN 5-325 MG PO TABS: 1.0000 | ORAL_TABLET | Freq: Four times a day (QID) | ORAL | Status: DC | PRN

## 2013-12-06 MED ORDER — SODIUM CHLORIDE 0.9 % IV SOLN: 250.0000 mL | INTRAVENOUS | Status: DC | PRN

## 2013-12-06 MED ORDER — SODIUM CHLORIDE 0.9 % IJ SOLN
3.0000 mL | Freq: Two times a day (BID) | INTRAMUSCULAR | Status: DC
  Administered 2013-12-08: 3 mL via INTRAVENOUS

## 2013-12-06 MED ORDER — POTASSIUM CHLORIDE CRYS ER 20 MEQ PO TBCR
40.0000 meq | EXTENDED_RELEASE_TABLET | Freq: Once | ORAL | Status: AC
  Administered 2013-12-06: 40 meq via ORAL
  Filled 2013-12-06: qty 2

## 2013-12-06 NOTE — ED Notes (Signed)
Admitting MD at bedside.

## 2013-12-06 NOTE — ED Provider Notes (Signed)
CSN: [id number]     Arrival date & time 12/06/13  7829 History   First MD Initiated Contact with Patient 12/06/13 1029     Chief Complaint  Patient presents with  . Abdominal Pain  . Shortness of Breath     (Consider location/radiation/quality/duration/timing/severity/associated sxs/prior Treatment) Patient is a 58 y.o. female presenting with shortness of breath.  Shortness of Breath Severity:  Moderate Onset quality:  Gradual Duration:  5 days Timing:  Constant Chronicity:  New Context: not activity, not occupational exposure and not URI   Relieved by:  None tried Worsened by:  Exertion Ineffective treatments:  None tried Associated symptoms: abdominal pain   Associated symptoms: no chest pain, no cough, no fever and no vomiting     Past Medical History  Diagnosis Date  . Cirrhosis   . Hypertension   . Anxiety   . Hepatitis C   . Tobacco abuse 11/17/2013   Past Surgical History  Procedure Laterality Date  . Shoulder surgery    . Humerus fracture surgery    . Esophagogastroduodenoscopy N/A 11/18/2013    Procedure: ESOPHAGOGASTRODUODENOSCOPY (EGD);  Surgeon: Rachael Fee, MD;  Location: [facility] ENDOSCOPY;  Service: Endoscopy;  Laterality: N/A;   Family History  Problem Relation Age of Onset  . COPD Mother   . CAD Sister   . Diabetes Mellitus II Sister   . Colon cancer Neg Hx    History  Substance Use Topics  . Smoking status: Current Every Day Smoker -- 0.50 packs/day  . Smokeless tobacco: Not on file  . Alcohol Use: No   OB History    No data available     Review of Systems  Constitutional: Positive for activity change. Negative for fever.  HENT: Negative for congestion.   Respiratory: Positive for shortness of breath. Negative for apnea and cough.   Cardiovascular: Positive for leg swelling. Negative for chest pain.  Gastrointestinal: Positive for abdominal pain and diarrhea. Negative for vomiting and blood in stool.  Endocrine: Negative for polydipsia  and polyuria.  Genitourinary: Negative for dysuria and hematuria.  Skin: Positive for color change. Negative for pallor and wound.  All other systems reviewed and are negative.     Allergies  Codeine  Home Medications   Prior to Admission medications   Medication Sig Start Date End Date Taking? Authorizing Provider  ALPRAZolam Prudy Feeler) 0.5 MG tablet Take 0.5 mg by mouth daily as needed. FOR ANXIETY 12/17/12   Historical Provider, MD  amLODipine (NORVASC) 10 MG tablet Take 10 mg by mouth daily. 01/13/13   Historical Provider, MD  ciprofloxacin (CIPRO) 500 MG tablet Take 1 tablet (500 mg total) by mouth 2 (two) times daily. X 4 days 11/23/13   Ripudeep Jenna Luo, MD  citalopram (CELEXA) 20 MG tablet Take 20 mg by mouth at bedtime.    Historical Provider, MD  dicyclomine (BENTYL) 20 MG tablet Take 20 mg by mouth daily. 12/17/12   Historical Provider, MD  furosemide (LASIX) 40 MG tablet Take 40 mg by mouth daily.    Historical Provider, MD  HYDROcodone-acetaminophen (NORCO) 5-325 MG per tablet Take 1-2 tabs by mouth every 6 hours when necessary pain. Patient not taking: Reported on 11/16/2013 06/29/11   [name], MD  labetalol (NORMODYNE) 100 MG tablet Take 50 mg by mouth 2 (two) times daily.    Historical Provider, MD  lactulose (CHRONULAC) 10 GM/15ML solution Take 45 mLs (30 g total) by mouth 4 (four) times daily. After meals and bed time for  atleast 2-3 BM's/day 11/23/13   Ripudeep Jenna LuoK Rai, MD  Ledipasvir-Sofosbuvir (HARVONI) 90-400 MG TABS Take 1 tablet by mouth daily. 04/01/13   Historical Provider, MD  LORazepam (ATIVAN) 0.5 MG tablet Take 0.5 mg by mouth every 8 (eight) hours as needed. For anxiety.    Historical Provider, MD  Multiple Vitamin (MULTIVITAMIN WITH MINERALS) TABS Take 1 tablet by mouth daily.    Historical Provider, MD  nicotine (NICODERM CQ - DOSED IN MG/24 HOURS) 14 mg/24hr patch Place 1 patch (14 mg total) onto the skin daily. 11/23/13   Ripudeep Jenna LuoK Rai, MD  ondansetron  (ZOFRAN ODT) 4 MG disintegrating tablet Take 1 tablet (4 mg total) by mouth every 8 (eight) hours as needed for nausea or vomiting. 11/23/13   Ripudeep Jenna LuoK Rai, MD  pantoprazole (PROTONIX) 40 MG tablet Take 1 tablet (40 mg total) by mouth 2 (two) times daily before a meal. 11/23/13   Ripudeep K Rai, MD  rifaximin (XIFAXAN) 550 MG TABS Take 550 mg by mouth 2 (two) times daily.    Historical Provider, MD  spironolactone (ALDACTONE) 25 MG tablet Take 25 mg by mouth daily.    Historical Provider, MD  Thiamine HCl (VITAMIN B-1 PO) Take 1 tablet by mouth daily.    Historical Provider, MD   BP 132/64 mmHg  Pulse 118  Temp(Src) 97.9 F (36.6 C) (Oral)  Resp 22  Ht 5\' 2"  (1.575 m)  Wt 186 lb (84.369 kg)  BMI 34.01 kg/m2  SpO2 96% Physical Exam  Constitutional: She is oriented to person, place, and time. She appears well-developed and well-nourished.  HENT:  Head: Normocephalic and atraumatic.  Eyes: Conjunctivae and EOM are normal. Right eye exhibits no discharge. Left eye exhibits no discharge. Scleral icterus is present.  Cardiovascular: Regular rhythm.  Tachycardia present.   Pulmonary/Chest: Effort normal and breath sounds normal. No respiratory distress.  Abdominal: Soft. She exhibits no distension. There is tenderness (minimal). There is no rebound.  Musculoskeletal: Normal range of motion. She exhibits edema. She exhibits no tenderness.  Neurological: She is alert and oriented to person, place, and time.  Skin: Skin is warm and dry.  Jaundice  Nursing note and vitals reviewed.   ED Course  Procedures (including critical care time) Labs Review Labs Reviewed  CBC WITH DIFFERENTIAL  COMPREHENSIVE METABOLIC PANEL  LIPASE, BLOOD  URINALYSIS, ROUTINE W REFLEX MICROSCOPIC  PROTIME-INR  D-DIMER, QUANTITATIVE  I-STAT TROPOININ, ED    Imaging Review No results found.   EKG Interpretation   Date/Time:  Tuesday December 06 2013 10:10:37 EST Ventricular Rate:  118 PR Interval:   126 QRS Duration: 86 QT Interval:  356 QTC Calculation: 498 R Axis:   47 Text Interpretation:  Sinus tachycardia Cannot rule out Anterior infarct ,  age undetermined Abnormal ECG Similar to prior Confirmed by Gwendolyn GrantWALDEN  MD,  BLAIR ([id number]) on 12/06/2013 10:39:25 AM      MDM   Final diagnoses:  None   58 year old female with a history of cirrhosis, hypertension and anxiety. Patient comes in for 5-6 days of progressively worsening shortness of breath and abdominal distention with nausea. Has not had fevers no significant chest pain or back pain unknown amount of weight gain. Exam with jaundice scleral icterus abdomen is benign lung sounds are clear heart is normal. Patient slid a tachycardic low risk for PE will check a d-dimer for that. We will also check a troponin basic labs. EKG is just a sinus tachycardia. Doubt ACS at this time. Symptoms could  be related to volume depletion and she's been taking lots of lactulose over the last week taken this may make her shortness of breath better. She is not in any respiratory distress get a chest x-ray to evaluate her lungs fluid as she does have some lower extremity edema. Disposition will be based on response to interventions and results of lab work.  Patient found to have UTI with minimal improvement in her tachycardia with fluids. Rocephin given.   liver enzymes INR slightly up from last time. Still tachypnea But more comfortable than previously. Rest of her labs relatively unremarkable aside from elevated d-dimer so we ordered a CTA PE study to evaluate that. Patient also slightly hyponatremic. We'll admit to medicine for further imaging, workup and management.     Marily MemosJason Ankur Snowdon, MD 12/06/13 1702  Elwin MochaBlair Walden, MD 12/07/13 [phone]

## 2013-12-06 NOTE — Progress Notes (Signed)
Pt. Arrived to unit via stretcher with ED tech Bosie Clos(Judith). Pt. Is alert and oriented. No signs of distress or skin issues noted. Vitals appear stable. Pt. Accompanied by female friend. Educated pt. On use of call bell and discussed with pt. What current plan of care is. Pt. Is comfortable. No further needs noted at this time.

## 2013-12-06 NOTE — H&P (Signed)
[facility] History and Physical                                                                                    Patient Demographics  Cynthia Mathews, is a 58 y.o. female  MRN: [id number]   DOB - February 18, 1955  Admit Date - 12/06/2013  Outpatient Primary MD for the patient is Cassell Smiles., MD   Past Medical History  Diagnosis Date  . Cirrhosis   . Hypertension   . Anxiety   . Hepatitis C   . Tobacco abuse 11/17/2013      Past Surgical History  Procedure Laterality Date  . Shoulder surgery    . Humerus fracture surgery    . Esophagogastroduodenoscopy N/A 11/18/2013    Procedure: ESOPHAGOGASTRODUODENOSCOPY (EGD);  Surgeon: Rachael Fee, MD;  Location: [facility] ENDOSCOPY;  Service: Endoscopy;  Laterality: N/A;    in for   Chief Complaint  Patient presents with  . Abdominal Pain  . Shortness of Breath     HPI  Cynthia Mathews  is a 58 y.o. female, with PMH of cirrhosis, hepatitis C, hypertension, anxiety, and tobacco abuse presents to the ED with complaints of abdominal pain- burning sensation,15 lb. wt gain since previous discharge on 11/18, and rapid heart rate.  Notes some shortness of breath but is more concerned with abdominal pain and wt gain.  Recent hospitalization for GI bleed.  Hx of hepatic encephalopathy.  Denies any alcohol use.  Followed by GI- Dr. Jacqualine Mau at [facility].  Recently completed Harvoini and taken off of transplant list due to improvement in condition.  Compliant with medications.  Denies any fever, vomiting, hemetemesis, hematochezia, melena, dysuria, hematuria.             Review of Systems    In addition to the HPI above,  No Fever, No Chest pain, Cough, No Vomiting, Bowel movements are regular, No Blood in stool or Urine, No dysuria, No new skin rashes or bruises, No new joints pains-aches,  No new weakness, tingling, numbness in any extremity,   A full 10 point Review of Systems was done, except as stated above, all other Review of Systems were  negative.   Social History History  Substance Use Topics  . Smoking status: Current Every Day Smoker -- 0.50 packs/day  . Smokeless tobacco: Not on file  . Alcohol Use: No     Family History Family History  Problem Relation Age of Onset  . COPD Mother   . CAD Sister   . Diabetes Mellitus II Sister   . Colon cancer Neg Hx      Prior to Admission medications   Medication Sig Start Date End Date Taking? Authorizing Provider  ALPRAZolam Prudy Feeler) 0.5 MG tablet Take 0.5 mg by mouth daily as needed. FOR ANXIETY 12/17/12  Yes Historical Provider, MD  amLODipine (NORVASC) 10 MG tablet Take 10 mg by mouth daily. 01/13/13  Yes Historical Provider, MD  citalopram (CELEXA) 20 MG tablet Take 20 mg by mouth at bedtime.   Yes Historical Provider, MD  dicyclomine (BENTYL) 20 MG tablet Take 20 mg by mouth daily as needed for spasms.  12/17/12  Yes Historical Provider,  MD  furosemide (LASIX) 40 MG tablet Take 40 mg by mouth 2 (two) times daily.    Yes Historical Provider, MD  labetalol (NORMODYNE) 100 MG tablet Take 50 mg by mouth 2 (two) times daily.   Yes Historical Provider, MD  lactulose (CHRONULAC) 10 GM/15ML solution Take 45 mLs (30 g total) by mouth 4 (four) times daily. After meals and bed time for atleast 2-3 BM's/day 11/23/13  Yes Ripudeep Jenna Luo, MD  Multiple Vitamin (MULTIVITAMIN WITH MINERALS) TABS Take 1 tablet by mouth daily.   Yes Historical Provider, MD  rifaximin (XIFAXAN) 550 MG TABS Take 550 mg by mouth 2 (two) times daily.   Yes Historical Provider, MD  spironolactone (ALDACTONE) 25 MG tablet Take 25 mg by mouth 2 (two) times daily.    Yes Historical Provider, MD  Thiamine HCl (VITAMIN B-1 PO) Take 1 tablet by mouth daily.   Yes Historical Provider, MD  ciprofloxacin (CIPRO) 500 MG tablet Take 1 tablet (500 mg total) by mouth 2 (two) times daily. X 4 days Patient not taking: Reported on 12/06/2013 11/23/13   Ripudeep Jenna Luo, MD  HYDROcodone-acetaminophen (NORCO) 5-325 MG per tablet  Take 1-2 tabs by mouth every 6 hours when necessary pain. Patient not taking: Reported on 12/06/2013 06/29/11   [name], MD  Ledipasvir-Sofosbuvir (HARVONI) 90-400 MG TABS Take 1 tablet by mouth daily. 04/01/13   Historical Provider, MD  LORazepam (ATIVAN) 0.5 MG tablet Take 0.5 mg by mouth every 8 (eight) hours as needed. For anxiety.    Historical Provider, MD  nicotine (NICODERM CQ - DOSED IN MG/24 HOURS) 14 mg/24hr patch Place 1 patch (14 mg total) onto the skin daily. Patient not taking: Reported on 12/06/2013 11/23/13   Ripudeep Jenna Luo, MD  ondansetron (ZOFRAN ODT) 4 MG disintegrating tablet Take 1 tablet (4 mg total) by mouth every 8 (eight) hours as needed for nausea or vomiting. Patient not taking: Reported on 12/06/2013 11/23/13   Ripudeep Jenna Luo, MD  pantoprazole (PROTONIX) 40 MG tablet Take 1 tablet (40 mg total) by mouth 2 (two) times daily before a meal. Patient not taking: Reported on 12/06/2013 11/23/13   Ripudeep Jenna Luo, MD    Allergies  Allergen Reactions  . Codeine Nausea Only    Physical Exam  Vitals  Blood pressure 136/62, pulse 116, temperature 97.9 F (36.6 C), temperature source Oral, resp. rate 16, height 5\' 2"  (1.575 m), weight 84.369 kg (186 lb), SpO2 96 %.   General:  Sitting in bed, appears uncomfortable, jaundiced and in mild distress  Psych:  Normal affect and insight, Awake Alert, Oriented X 3. Neuro:   No F.N deficits ENT:  Scleral icterus, Moist Oral Mucosa. Respiratory:  Symmetrical Chest wall movement, Good air movement bilaterally, CTAB Cardiac: Tachycardiac- regular rhythm, No Gallops, Rubs or Murmurs, No Parasternal Heave. Abdomen:  Distended abdomen, Abdomen Soft, Non tender, no rebound or guarding, No organomegaly appreciated, +bowel sounds Skin:  No Cyanosis, Normal Skin Turgor, No Skin Rash or Bruise. Extremities:  1+ edema   Data Review  CBC  Recent Labs Lab 12/06/13 1300  WBC 7.6  HGB 12.4  HCT 35.0*  PLT 65*  MCV 102.6*  MCH  35.7*  MCHC 35.8  RDW 16.0*  LYMPHSABS 1.2  MONOABS 0.9  EOSABS 0.1  BASOSABS 0.1   ------------------------------------------------------------------------------------------------------------------  Chemistries   Recent Labs Lab 12/06/13 1300  NA 128*  K 3.4*  CL 92*  CO2 25  GLUCOSE 129*  BUN 3*  CREATININE 0.55  CALCIUM 7.7*  AST 135*  ALT 39*  ALKPHOS 93  BILITOT 8.3*   ------------------------------------------------------------------------------------------------------------------ estimated creatinine clearance is 78.1 mL/min (by C-G formula based on Cr of 0.55). ------------------------------------------------------------------------------------------------------------------  Coagulation profile  Recent Labs Lab 12/06/13 1300  INR 2.35*   -------------------------------------------------------------------------------------------------------------------  Recent Labs  12/06/13 1300  DDIMER 3.76*   ---------------------------------------------------------------------------------------------------------------  Urinalysis    Component Value Date/Time   COLORURINE BROWN* 12/06/2013 1137   APPEARANCEUR CLOUDY* 12/06/2013 1137   LABSPEC >1.030* 12/06/2013 1137   PHURINE 6.5 12/06/2013 1137   GLUCOSEU NEGATIVE 12/06/2013 1137   HGBUR SMALL* 12/06/2013 1137   BILIRUBINUR LARGE* 12/06/2013 1137   KETONESUR 15* 12/06/2013 1137   PROTEINUR 100* 12/06/2013 1137   UROBILINOGEN 1.0 12/06/2013 1137   NITRITE POSITIVE* 12/06/2013 1137   LEUKOCYTESUR SMALL* 12/06/2013 1137   ----------------------------------------------------------------------------------------------------------------  Imaging results:   Dg Chest Port 1 View  11/16/2013   CLINICAL DATA:  Vomiting blood. Shortness of breath today. History of cirrhosis.  EXAM: PORTABLE CHEST - 1 VIEW  COMPARISON:  06/04/2010  FINDINGS: Heart and mediastinal contours are within normal limits. Scarring at the  left lung base. Mild peribronchial thickening and prominent interstitial markings, similar prior study. No confluent opacities or effusions. No acute bony abnormality.  IMPRESSION: Mild peribronchial thickening and interstitial prominence, similar to prior study. This may reflect mild chronic bronchitis.  Lingular scarring.   Electronically Signed   By: Charlett NoseKevin  Dover M.D.   On: 11/16/2013 19:22    My personal review of EKG: Rhythm Sinus tachycardia, Rate  118 /min, QTc 498 , no Acute ST changes    Assessment & Plan  Sepsis: based on elevated heart rate, respiratory rate and evidence of a UTI. - Afebrile and blood pressure is stable.  - Will obtain blood and urine cultures.   - start IV Ceftriaxone.        Hepatic cirrhosis due to chronic hepatitis C infection: Likely cause of abdominal pain and recent wt gain - likely ascities.  Denies alcohol use.  - with recent decompensation with recent GI bleed now possible ascites and history of HE - Recently completed Harvoni therapy and [facility] of [facility]  - Abnormal LFTs- AST 135, ALT 39, total bili: 8.3. INR 2.35.  - MELD score of 24 indicating estimated 19.6% 3 month mortality.  Will obtain abdominal ultrasound to evaluate for ascites.  May need to be back on the transplant list again due to MELD score, called [facility] and left a message this evening.  - Will restart Lasix and spironolactone on 12/02.  Continue Lactulose, labetalol, and rifaximin.  Tachycardia: in the setting of sepsis.  Pt also notes she has not taken labetalol today.  Will resume labetalol.    Thrombocytopenia:  Chronic problem- due to chronic liver disease.  Plts 65 today- 36-40 during last admission.         Coagulopathy:  Chronic problem- due to chronic liver disease.  INR 2.35- increasing 2.00- 2.24 during last admission.      Portal hypertensive gastropathy: s/p recent GI bleed, now stable   Hyponatremia: hypervolemic  UTI (lower urinary tract  infection): UA suggestive of UTI.  Urine culture pending.  Will start IV Ceftriaxone.           Hypertension: Chronic problem.  Will hold Amlodipine.  Will continue Labetalol.  Anxiety: Chronic problem.  Will continue Citalopram, Lorazepam, and Alprazolam.   Tobacco abuse: Will counsel for cessation.    DVT Prophylaxis  SCD Family Communication: d/w sister bedside Fluids: none, s/p bolus 1L in the ED  Code Status:  Full Condition:  Guarded Time spent in minutes : 60  Vicki MalletStevenson, Jessica I PA-S on 12/06/2013 at 3:58 PM    Patient seen and examined, chart and data base reviewed.  I agree with the above assessment and plan and note edited by me.  Pamella Pertostin Gherghe, MD [facility] [phone]  Between 7am to 7pm - Pager - [phone] After 7pm go to [email or web address] - password [email or web address] And look for the night coverage person covering me after hours  [facility] Office  [phone]

## 2013-12-06 NOTE — ED Notes (Signed)
Pt states that she has had increasing abdominal distention, pain, and SOB. Pt has HX of cirrosis. Pt states that she was recently admitted for the same and tried prescriptions with no relief.

## 2013-12-07 DIAGNOSIS — I10 Essential (primary) hypertension: Secondary | ICD-10-CM

## 2013-12-07 DIAGNOSIS — J189 Pneumonia, unspecified organism: Secondary | ICD-10-CM | POA: Insufficient documentation

## 2013-12-07 LAB — COMPREHENSIVE METABOLIC PANEL
ALT: 31 U/L (ref 0–35)
ANION GAP: 9 (ref 5–15)
AST: 101 U/L — AB (ref 0–37)
Albumin: 1.6 g/dL — ABNORMAL LOW (ref 3.5–5.2)
Alkaline Phosphatase: 75 U/L (ref 39–117)
BUN: 4 mg/dL — ABNORMAL LOW (ref 6–23)
CALCIUM: 7 mg/dL — AB (ref 8.4–10.5)
CO2: 25 mEq/L (ref 19–32)
CREATININE: 0.57 mg/dL (ref 0.50–1.10)
Chloride: 100 mEq/L (ref 96–112)
GFR calc Af Amer: >90 mL/min (ref >90)
GFR calc non Af Amer: >90 mL/min (ref >90)
Glucose, Bld: 96 mg/dL (ref 70–99)
Potassium: 3.6 mEq/L — ABNORMAL LOW (ref 3.7–5.3)
Sodium: 134 mEq/L — ABNORMAL LOW (ref 137–147)
Total Bilirubin: 6.8 mg/dL — ABNORMAL HIGH (ref 0.3–1.2)
Total Protein: 6 g/dL (ref 6.0–8.3)

## 2013-12-07 LAB — CBC
HCT: 30.6 % — ABNORMAL LOW (ref 36.0–46.0)
HEMOGLOBIN: 10.7 g/dL — AB (ref 12.0–15.0)
MCH: 35.1 pg — AB (ref 26.0–34.0)
MCHC: 35 g/dL (ref 30.0–36.0)
MCV: 100.3 fL — ABNORMAL HIGH (ref 78.0–100.0)
Platelets: 61 10*3/uL — ABNORMAL LOW (ref 150–400)
RBC: 3.05 MIL/uL — ABNORMAL LOW (ref 3.87–5.11)
RDW: 16.7 % — AB (ref 11.5–15.5)
WBC: 5.2 10*3/uL (ref 4.0–10.5)

## 2013-12-07 LAB — PROTIME-INR
INR: 2.42 — ABNORMAL HIGH (ref 0.00–1.49)
PROTHROMBIN TIME: 26.5 s — AB (ref 11.6–15.2)

## 2013-12-07 LAB — PHOSPHORUS: Phosphorus: 2.4 mg/dL (ref 2.3–4.6)

## 2013-12-07 LAB — MAGNESIUM: Magnesium: 1.6 mg/dL (ref 1.5–2.5)

## 2013-12-07 MED ORDER — LEVOFLOXACIN IN D5W 750 MG/150ML IV SOLN
750.0000 mg | INTRAVENOUS | Status: DC
  Filled 2013-12-07: qty 150

## 2013-12-07 MED ORDER — SPIRONOLACTONE 25 MG PO TABS
25.0000 mg | ORAL_TABLET | Freq: Every day | ORAL | Status: DC
  Administered 2013-12-07 – 2013-12-09 (×3): 25 mg via ORAL
  Filled 2013-12-07 (×3): qty 1

## 2013-12-07 MED ORDER — FUROSEMIDE 40 MG PO TABS
40.0000 mg | ORAL_TABLET | Freq: Every day | ORAL | Status: DC
  Administered 2013-12-07 – 2013-12-09 (×3): 40 mg via ORAL
  Filled 2013-12-07 (×3): qty 1

## 2013-12-07 MED ORDER — LABETALOL HCL 100 MG PO TABS
50.0000 mg | ORAL_TABLET | Freq: Once | ORAL | Status: AC
  Administered 2013-12-07: 50 mg via ORAL
  Filled 2013-12-07: qty 0.5

## 2013-12-07 MED ORDER — AZITHROMYCIN 500 MG IV SOLR
500.0000 mg | INTRAVENOUS | Status: DC
  Administered 2013-12-07: 500 mg via INTRAVENOUS
  Filled 2013-12-07 (×2): qty 500

## 2013-12-07 MED ORDER — MAGNESIUM SULFATE 50 % IJ SOLN
3.0000 g | Freq: Once | INTRAVENOUS | Status: AC
  Administered 2013-12-07 (×2): 3 g via INTRAVENOUS
  Filled 2013-12-07: qty 6

## 2013-12-07 NOTE — Plan of Care (Signed)
Problem: Phase I Progression Outcomes Goal: Hemodynamically stable Outcome: Completed/Met Date Met:  12/07/13

## 2013-12-07 NOTE — Plan of Care (Signed)
Problem: Phase I Progression Outcomes Goal: OOB as tolerated unless otherwise ordered Outcome: Completed/Met Date Met:  12/07/13     

## 2013-12-07 NOTE — Care Management Note (Unsigned)
    Page 1 of 1   12/07/2013     4:52:44 PM CARE MANAGEMENT NOTE 12/07/2013  Patient:  Catalina GravelHARRIS,Adrianna S   Account Number:  [id number]  Date Initiated:  12/07/2013  Documentation initiated by:  Letha CapeAYLOR,DEBORAH  Subjective/Objective Assessment:   dx hepatic cirrhosis, sepsis, uti, abd pain  admit -lives alone     Action/Plan:   CM did speak with pt in regards to insurance and PCP. Pt out on medical leave since Jan. of this year. [facility]. Pt filed for disability June, 2015. CM will call [facility] for assistance with bill and possible Medicaid.   Anticipated DC Date:  12/09/2013   Anticipated DC Plan:  HOME/SELF CARE  In-house referral  Financial Counselor      DC Planning Services  CM consult  MATCH Program  Medication Assistance      Choice offered to / List presented to:             Status of service:  In process, will continue to follow Medicare Important Message given?   (If response is "NO", the following Medicare IM given date fields will be blank) Date Medicare IM given:   Medicare IM given by:   Date Additional Medicare IM given:   Additional Medicare IM given by:    Discharge Disposition:    Per UR Regulation:  Reviewed for med. necessity/level of care/duration of stay  If discussed at Long Length of Stay Meetings, dates discussed:    Comments:  CM did discuss with pt the [facility] $4.00 medication list for some medications. CM will continue to monitor for disposition needs.

## 2013-12-07 NOTE — Plan of Care (Signed)
Problem: Phase II Progression Outcomes Goal: Tolerating diet Outcome: Completed/Met Date Met:  12/07/13

## 2013-12-07 NOTE — Plan of Care (Signed)
Problem: Phase I Progression Outcomes Goal: Pain controlled with appropriate interventions Outcome: Completed/Met Date Met:  12/07/13     

## 2013-12-07 NOTE — Plan of Care (Signed)
Problem: Phase I Progression Outcomes Goal: Tolerating diet Outcome: Completed/Met Date Met:  12/07/13     

## 2013-12-07 NOTE — Progress Notes (Signed)
Chaplain responded to spiritual care consult for healthcare power of attorney. Chaplain explained the contents of advanced directive to the pt. Pt will discuss advanced directive with her sister this evening. Chaplain told pt to have her nurse page the chaplain when the document has been filled out. Page chaplain as needed. [phone]   12/07/13 1500  Clinical Encounter Type  Visited With Patient  Visit Type Initial  Referral From Nurse  Consult/Referral To Chaplain  Advance Directives (For Healthcare)  Does patient have an advance directive? No  Would patient like information on creating an advanced directive? Yes - Transport plannerducational materials given;Yes - Spiritual care consult ordered  Jiles HaroldStamey, Allice Garro F, Chaplain 12/07/2013 3:04 PM

## 2013-12-07 NOTE — Progress Notes (Signed)
[facility] PROGRESS NOTE  Cynthia Mathews LKG:[id number] DOB: [birth year] DOA: 12/06/2013 PCP: Cassell Smiles., MD  Assessment/Plan   Sepsis -Resolved, VSS -Presented with tachycardia, tachypnea,  UTI, pneumonia -Blood and urine cxs pending -Continue IV Ceftriaxone (day 2) for UTI -Placed on IV azithromycin (day 1) for pneumonia  Hepatic cirrhosis due to chronic Hep C infection -Presented with burning abdominal pain and recent weight gain, likely due to ascities. Denies alcohol use.  -Has completed Harvoni treatment, follows with [facility] -LFTs- AST 135, ALT 39, total bili: 8.3. INR 2.35.  -MELD score of 24 with 19.6% 3 month mortality risk. Admitting physician spoke with [facility] for potential of returning pt on transplant list.  -[place] abd- small amount of ascities -Will start Lasix 40mg  and Aldactone 25mg  daily -Continue Lactulose, labetalol, and rifaximin  Pneumonia  CTA chest- small opacity in right middle lobe may be secondary to pneumonia -Placed on IV Azithromycin (day 1)  UTI -UA- evidence of UTI -Urine cxs pending -Continue IV Ceftriaxone  Hypertension -BP stable 117/56 -hold Amlodipine. Started Lasix 40 mg daily , Aldactone 25 mg daily, and Labetalol dose decreased to 50 mg daily  Tachycardia -Resolved.  -Will resume Labetelol at lower dose of 50 mg daily  Hx of GI bleed due to Portal Gastropathy -stable with no signs of bleed -EGD 11/2013- with portal gastropathy, no evidence gastric/espophageal of varices -Continue Pantoprazole 40mg  BID  Hypokalemia -K 3.6 -replete with KDUR X1 -BMET in am  Thrombocytopenia -Due to chronic liver disease -Plat improved to 109   Coagulopathy -Due to chronic liver disease -INR 2.42  Hyponatremia  -Likely hypervolemia- resolving -Monitor BMET in am  Anxiety -Continue Citalopram  Lorazepam, Alprazolam  Tobacco abuse  -counseled on cessation -Provided with nicoderm   DVT Prophylaxis:   SCDs Code Status: Full Family Communication: Family at bedside Disposition Plan: Home when stable   Consultants:  None  Procedures:  None  Antibiotics:  IV Ceftriaxone (day 2)  IV Azithromycin (day 1)  HPI/Subjective: Cynthia Mathews is a 58 yo female with PMH of Cirrhosis of liver due to Hep C infection, Thrombocytopenia, Coagulopathy, HTN, Anxiety, Tobacco abuse, Shortness of breath, Portal gastropathy with recent GI bleed 11/2013 placed on PPI BID,  and anemia that presents with burning abdominal pain and 15 Ib.wt for past 2 weeks. Pt was recently  Discharged 11/18 after admission for GI bleed secondary to portal gastropathy and placed on BID PPI.  She deniess any alcohol use. She also complains of mild sob. She denies fever, chills, chest pain, vomiting, hemetemesis, hematochezia, melena, dysuria, or hematuria.  She follows up with Dr.  Jacqualine Mau at [facility] and had recently completed Harvoni treatment for Hep C and due to improvement on medication, patient was taken out of transplant list. Patient is found to be septic with tachycardia, tachypnea, and source of UTI and pneumonia. She is admitted for further management  States abd pain is better.  Denies sob, cp.  Objective: Filed Vitals:   12/07/13 1002  BP: 99/43  Pulse:   Temp:   Resp:     Intake/Output Summary (Last 24 hours) at 12/07/13 1031 Last data filed at 12/07/13 1003  Gross per 24 hour  Intake    340 ml  Output      0 ml  Net    340 ml   Filed Weights   12/06/13 1014 12/07/13 0541  Weight: 84.369 kg (186 lb) 89.767 kg (197 lb 14.4 oz)  Exam:  Gen: Alert Caucasian female in NAD  HEENT: Normocephalic, atraumatic.  Pupils symmertrical.  Moist mucosa.   Chest: clear to auscultate bilaterally, no ronchi or rales  Cardiac: Regular rate and rhythm, S1-S2, no rubs murmurs or gallops  Abdomen: soft, non tender,  distended, +bowel sounds. No guarding or rigidity  Extremities: Symmetrical in appearance  without cyanosis or edema  Neurological: Alert awake oriented to time place and person.  Psychiatric: Appears normal.   Data Reviewed: Basic Metabolic Panel:  Recent Labs Lab 12/06/13 1300 12/07/13 0607  NA 128* 134*  K 3.4* 3.6*  CL 92* 100  CO2 25 25  GLUCOSE 129* 96  BUN 3* 4*  CREATININE 0.55 0.57  CALCIUM 7.7* 7.0*  MG  --  1.6  PHOS  --  2.4   Liver Function Tests:  Recent Labs Lab 12/06/13 1300 12/07/13 0607  AST 135* 101*  ALT 39* 31  ALKPHOS 93 75  BILITOT 8.3* 6.8*  PROT 7.3 6.0  ALBUMIN 1.8* 1.6*    Recent Labs Lab 12/06/13 1300  LIPASE 70*   No results for input(s): AMMONIA in the last 168 hours. CBC:  Recent Labs Lab 12/06/13 1300 12/07/13 0607  WBC 7.6 5.2  NEUTROABS 5.4  --   HGB 12.4 10.7*  HCT 35.0* 30.6*  MCV 102.6* 100.3*  PLT 65* 61*   Cardiac Enzymes: No results for input(s): CKTOTAL, CKMB, CKMBINDEX, TROPONINI in the last 168 hours. BNP (last 3 results) No results for input(s): PROBNP in the last 8760 hours. CBG: No results for input(s): GLUCAP in the last 168 hours.  No results found for this or any previous visit (from the past 240 hour(s)).   Studies: Ct Angio Chest Pe W/cm &/or Wo Cm  12/06/2013   CLINICAL DATA:  Tachycardia and shortness of breath beginning last night.  EXAM: CT ANGIOGRAPHY CHEST WITH CONTRAST  TECHNIQUE: Multidetector CT imaging of the chest was performed using the standard protocol during bolus administration of intravenous contrast. Multiplanar CT image reconstructions and MIPs were obtained to evaluate the vascular anatomy.  CONTRAST:  80 mL OMNIPAQUE IOHEXOL 350 MG/ML SOLN  COMPARISON:  Single view of the chest 11/16/2013 07/27/2007.  FINDINGS: No pulmonary embolus is identified. Trace bilateral pleural effusions are seen. There is no pericardial effusion. Cardiomegaly is noted. Calcific coronary artery disease is seen. There is no axillary, hilar or mediastinal lymphadenopathy. Small focus of airspace  opacity is seen in the right middle lobe. There is also some dependent airspace disease bilaterally. The lungs are otherwise unremarkable.  Visualized upper abdomen shows a small amount of ascites. The liver is shrunken with a nodular border consistent with cirrhosis. Imaged intra-abdominal contents are otherwise unremarkable. No lytic or sclerotic bony lesion is seen.  Review of the MIP images confirms the above findings.  IMPRESSION: Negative for pulmonary embolus.  Small focus of airspace opacity in the right middle lobe could be secondary to pneumonia. Dependent bilateral airspace disease has an appearance most compatible with atelectasis.  Cardiomegaly and calcific coronary artery disease.  Trace bilateral pleural effusions.  Cirrhotic liver with associated small volume of upper abdominal ascites.   Electronically Signed   By: Drusilla Kannerhomas  Dalessio M.D.   On: 12/06/2013 19:37   [place] Abdomen Limited  12/06/2013   CLINICAL DATA:  Cirrhosis, hepatitis-C, thrombocytopenia, assess for ascites  EXAM: LIMITED ABDOMEN ULTRASOUND FOR ASCITES  TECHNIQUE: Limited ultrasound survey for ascites was performed in all four abdominal quadrants.  COMPARISON:  None.  FINDINGS: Small amount  of abdominal ascites predominantly right upper quadrant and both lower quadrants.  IMPRESSION: Small amount of diffuse abdominal ascites.   Electronically Signed   By: Ruel Favorsrevor  Shick M.D.   On: 12/06/2013 19:14    Scheduled Meds: . azithromycin  500 mg Intravenous Q24H  . cefTRIAXone (ROCEPHIN)  IV  1 g Intravenous Q24H  . citalopram  20 mg Oral QHS  . labetalol  50 mg Oral BID  . lactulose  30 g Oral TID PC & HS  . [COMPLETED] magnesium sulfate 1 - 4 g bolus IVPB  3 g Intravenous Once  . multivitamin with minerals  1 tablet Oral Daily  . nicotine  14 mg Transdermal Daily  . pantoprazole  40 mg Oral BID AC  . rifaximin  550 mg Oral BID  . sodium chloride  3 mL Intravenous Q12H  . sodium chloride  3 mL Intravenous Q12H   Continuous  Infusions:   Principal Problem:   Sepsis Active Problems:   Hepatic cirrhosis due to chronic hepatitis C infection   Hypertension   Anxiety   Tachycardia   Tobacco abuse   Thrombocytopenia   Coagulopathy   Portal hypertensive gastropathy   UTI (lower urinary tract infection)   Hyponatremia    Time spent: 8145    Illa LevelOsman, Sahar [facility] LPA-C  [facility] Pager [phone]. If 7PM-7AM, please contact night-coverage at [email or web address], password [email or web address] 12/07/2013, 10:31 AM  LOS: 1 day

## 2013-12-07 NOTE — Plan of Care (Signed)
Problem: Phase I Progression Outcomes Goal: Voiding-avoid urinary catheter unless indicated Outcome: Completed/Met Date Met:  12/07/13     

## 2013-12-08 DIAGNOSIS — J189 Pneumonia, unspecified organism: Secondary | ICD-10-CM | POA: Insufficient documentation

## 2013-12-08 LAB — FOLATE: FOLATE: 4.2 ng/mL

## 2013-12-08 LAB — COMPREHENSIVE METABOLIC PANEL
ALBUMIN: 1.6 g/dL — AB (ref 3.5–5.2)
ALT: 30 U/L (ref 0–35)
AST: 103 U/L — AB (ref 0–37)
Alkaline Phosphatase: 95 U/L (ref 39–117)
Anion gap: 9 (ref 5–15)
BUN: 4 mg/dL — ABNORMAL LOW (ref 6–23)
CALCIUM: 7.5 mg/dL — AB (ref 8.4–10.5)
CO2: 26 mEq/L (ref 19–32)
Chloride: 100 mEq/L (ref 96–112)
Creatinine, Ser: 0.69 mg/dL (ref 0.50–1.10)
GFR calc non Af Amer: >90 mL/min (ref >90)
Glucose, Bld: 103 mg/dL — ABNORMAL HIGH (ref 70–99)
POTASSIUM: 3.5 meq/L — AB (ref 3.7–5.3)
SODIUM: 135 meq/L — AB (ref 137–147)
Total Bilirubin: 5.6 mg/dL — ABNORMAL HIGH (ref 0.3–1.2)
Total Protein: 6.2 g/dL (ref 6.0–8.3)

## 2013-12-08 LAB — CBC
HCT: 30.3 % — ABNORMAL LOW (ref 36.0–46.0)
Hemoglobin: 10.6 g/dL — ABNORMAL LOW (ref 12.0–15.0)
MCH: 35.5 pg — ABNORMAL HIGH (ref 26.0–34.0)
MCHC: 35 g/dL (ref 30.0–36.0)
MCV: 101.3 fL — ABNORMAL HIGH (ref 78.0–100.0)
PLATELETS: 52 10*3/uL — AB (ref 150–400)
RBC: 2.99 MIL/uL — ABNORMAL LOW (ref 3.87–5.11)
RDW: 16.8 % — AB (ref 11.5–15.5)
WBC: 4.3 10*3/uL (ref 4.0–10.5)

## 2013-12-08 LAB — VITAMIN B12: Vitamin B-12: >2000 pg/mL — ABNORMAL HIGH (ref 211–911)

## 2013-12-08 LAB — URINE CULTURE: Colony Count: 90000

## 2013-12-08 LAB — RETICULOCYTES
RBC.: 3.18 MIL/uL — ABNORMAL LOW (ref 3.87–5.11)
RETIC COUNT ABSOLUTE: 120.8 10*3/uL (ref 19.0–186.0)
Retic Ct Pct: 3.8 % — ABNORMAL HIGH (ref 0.4–3.1)

## 2013-12-08 LAB — FERRITIN: Ferritin: 1448 ng/mL — ABNORMAL HIGH (ref 10–291)

## 2013-12-08 MED ORDER — SULFAMETHOXAZOLE-TRIMETHOPRIM 800-160 MG PO TABS
1.0000 | ORAL_TABLET | Freq: Two times a day (BID) | ORAL | Status: DC
  Administered 2013-12-08 – 2013-12-09 (×3): 1 via ORAL
  Filled 2013-12-08 (×4): qty 1

## 2013-12-08 MED ORDER — AMOXICILLIN-POT CLAVULANATE 875-125 MG PO TABS
1.0000 | ORAL_TABLET | Freq: Two times a day (BID) | ORAL | Status: DC
  Administered 2013-12-08 – 2013-12-09 (×3): 1 via ORAL
  Filled 2013-12-08 (×5): qty 1

## 2013-12-08 MED ORDER — POTASSIUM CHLORIDE CRYS ER 20 MEQ PO TBCR
40.0000 meq | EXTENDED_RELEASE_TABLET | Freq: Once | ORAL | Status: AC
  Administered 2013-12-08: 40 meq via ORAL
  Filled 2013-12-08: qty 2

## 2013-12-08 MED ORDER — ZOLPIDEM TARTRATE 5 MG PO TABS
5.0000 mg | ORAL_TABLET | Freq: Every evening | ORAL | Status: DC | PRN
  Administered 2013-12-08: 5 mg via ORAL
  Filled 2013-12-08: qty 1

## 2013-12-08 NOTE — Plan of Care (Signed)
Problem: Phase II Progression Outcomes Goal: Voiding independently Outcome: Completed/Met Date Met:  12/08/13     

## 2013-12-08 NOTE — Plan of Care (Signed)
Problem: Phase II Progression Outcomes Goal: Progress activity as tolerated unless otherwise ordered Outcome: Completed/Met Date Met:  12/08/13     

## 2013-12-08 NOTE — Progress Notes (Signed)
[facility] PROGRESS NOTE  Cynthia Mathews ZOX:[id number] DOB: 02-04-1955 DOA: 12/06/2013 PCP: Cassell Smiles., MD  Assessment/Plan:  Sepsis -Resolved, VSS -presented with tachycardia, tachypnea, UTI, pneumonia -Blood cxs with no growth.  urine cxs  -Placed on  IV Ceftriaxone for 3 days, an IV azithromycin for 2 days, however, due to QT prolongation, antibiotics have been switched to Augmentin and Bactrim (Day 1).  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 Denies alcohol use.  -Has completed Harvoni treatment, follows with [facility] -LFTs- AST 135, ALT 39, total bili: 8.3. INR 2.35.  -MELD score of 24 with 19.6% 3 month mortality risk. Admitting physician spoke with [facility] for potential of returning pt on transplant list.  -[place] abd- small amount of ascitiesplan -Will start Lasix 40mg  and Aldactone 25mg  daily -Continue Lactulose, labetalol, and rifaximin  Pneumonia  CTA chest- small opacity in right middle lobe may be secondary to pneumonia -Placed on Augmentin (day 1).  Was on IV Azithromycin for 2 days   UTI -UA- evidence of UTI -Urine cxs pending -Placed on Bactrim (Day 1).   Was on IV Rocephin for 3 days.  QT Prolongation -EKG w/ QT/QTc 356/498 -Stopped QT prolonging meds:  Azithromycin, Rocephin, and Citalopram.  Hypertension -BP stable 117/56 -hold Amlodipine. Started Lasix 40 mg daily , Aldactone 25 mg daily, and Labetalol dose decreased to 50 mg daily  Tachycardia -Resolved.  -Continue Labetelol at lower dose of 50 mg daily  Hx of GI bleed due to Portal Gastropathy -stable with no signs of bleed -EGD 11/2013- with portal gastropathy, no evidence gastric/espophageal of varices -Continue Pantoprazole 40mg  BID  Hypokalemia -K 3.5 -replete with KDUR X1 -Magnesium normal -BMET in am  Thrombocytopenia -Due to chronic liver disease -Plat at  52  Coagulopathy -Due to chronic liver disease -INR 2.42  Hyponatremia  -Likely hypervolemia- resolving -Monitor BMET in am  Anxiety -Continue Lorazepam, Alprazolam.  Hold. Citalopram stopped due to QT prolongation  Tobacco abuse  -counseled on cessation -Provided with nicoderm  DVT Prophylaxis SCDs Code Status: Full Family Communication: No family at bedside Disposition Plan: Home when stable   Consultants:  None  Procedures:  None  Antibiotics:  IV Rocephin 12/1-12/03/2013  IV Azithromycin 12/2-12/3/15  Bactrim PO 12/3>>>  Augmentin PO 12/3>>   HPI/Subjective:  Cynthia Mathews is a 58 yo female with PMH of Cirrhosis of liver due to Hep C infection, Thrombocytopenia, Coagulopathy, HTN, Anxiety, Tobacco abuse, Shortness of breath, Portal gastropathy with recent GI bleed 11/2013 placed on PPI BID, and anemia that presents with burning abdominal pain and 15 Ib.wt for past 2 weeks. Pt was recently Discharged 11/18 after admission for GI bleed secondary to portal gastropathy and placed on BID PPI. She deniess any alcohol use. She also complains of mild sob. She denies fever, chills, chest pain, vomiting, hemetemesis, hematochezia, melena, dysuria, or hematuria. She follows up with Dr. Jacqualine Mau at [facility] and had recently completed Harvoni treatment for Hep C and due to improvement on medication, patient was taken out of transplant list. Patient is found to be septic with tachycardia, tachypnea, and source of UTI and pneumonia. She is admitted for further management  C/o's of not sleeping well last night. States much improvement in abd pain, has ambulated and had a  BM.  Objective: Filed Vitals:   12/08/13 0524  BP: 93/41  Pulse: 93  Temp: 98.5 F (36.9 C)  Resp: 18    Intake/Output Summary (Last 24 hours) at 12/08/13 1343 Last data filed at 12/07/13 2101  Gross per 24 hour  Intake    720 ml  Output      0 ml  Net    720 ml   Filed Weights   12/06/13  1014 12/07/13 0541 12/08/13 0524  Weight: 84.369 kg (186 lb) 89.767 kg (197 lb 14.4 oz) 90 kg (198 lb 6.6 oz)    Exam:  Gen: Alert and oriented, in no acute distress  HEENT: Normocephalic, atraumatic.  Pupils symmertrical.  Moist mucosa.   Chest: clear to auscultate bilaterally, no ronchi or rales  Cardiac: Regular rate and rhythm, S1-S2, no rubs murmurs or gallops  Abdomen: soft, non tender, non distended, +bowel sounds. No guarding or rigidity  Extremities: Symmetrical in appearance without cyanosis or edema  Neurological: Alert awake oriented to time place and person.  Psychiatric: Appears normal.   Data Reviewed: Basic Metabolic Panel:  Recent Labs Lab 12/06/13 1300 12/07/13 0607 12/08/13 0610  NA 128* 134* 135*  K 3.4* 3.6* 3.5*  CL 92* 100 100  CO2 25 25 26   GLUCOSE 129* 96 103*  BUN 3* 4* 4*  CREATININE 0.55 0.57 0.69  CALCIUM 7.7* 7.0* 7.5*  MG  --  1.6  --   PHOS  --  2.4  --    Liver Function Tests:  Recent Labs Lab 12/06/13 1300 12/07/13 0607 12/08/13 0610  AST 135* 101* 103*  ALT 39* 31 30  ALKPHOS 93 75 95  BILITOT 8.3* 6.8* 5.6*  PROT 7.3 6.0 6.2  ALBUMIN 1.8* 1.6* 1.6*    Recent Labs Lab 12/06/13 1300  LIPASE 70*   No results for input(s): AMMONIA in the last 168 hours. CBC:  Recent Labs Lab 12/06/13 1300 12/07/13 0607 12/08/13 0610  WBC 7.6 5.2 4.3  NEUTROABS 5.4  --   --   HGB 12.4 10.7* 10.6*  HCT 35.0* 30.6* 30.3*  MCV 102.6* 100.3* 101.3*  PLT 65* 61* 52*   Cardiac Enzymes: No results for input(s): CKTOTAL, CKMB, CKMBINDEX, TROPONINI in the last 168 hours. BNP (last 3 results) No results for input(s): PROBNP in the last 8760 hours. CBG: No results for input(s): GLUCAP in the last 168 hours.  Recent Results (from the past 240 hour(s))  Culture, Urine     Status: None   Collection Time: 12/06/13 11:29 AM  Result Value Ref Range Status   Specimen Description URINE, CLEAN CATCH  Final   Special Requests NONE  Final    Culture  Setup Time   Final    12/07/2013 12:00 Performed at [facility] Count   Final    90,000 COLONIES/ML Performed at [facility]    Culture   Final    Multiple bacterial morphotypes present, none predominant. Suggest appropriate recollection if clinically indicated. Performed at [facility]    Report Status 12/08/2013 FINAL  Final  Culture, blood (routine x 2)     Status: None (Preliminary result)   Collection Time: 12/06/13  6:11 PM  Result Value Ref Range Status   Specimen Description BLOOD LEFT ARM  Final   Special Requests BOTTLES DRAWN AEROBIC AND ANAEROBIC 5CC  Final   Culture  Setup Time   Final    12/07/2013 01:07 Performed at [facility]    Culture   Final           BLOOD CULTURE RECEIVED NO GROWTH  TO DATE CULTURE WILL BE HELD FOR 5 DAYS BEFORE ISSUING A FINAL NEGATIVE REPORT [facility]    Report Status PENDING  Incomplete  Culture, blood (routine x 2)     Status: None (Preliminary result)   Collection Time: 12/06/13  6:17 PM  Result Value Ref Range Status   Specimen Description BLOOD RIGHT ARM  Final   Special Requests BOTTLES DRAWN AEROBIC AND ANAEROBIC 3CC  Final   Culture  Setup Time   Final    12/07/2013 01:06 [facility]    Culture   Final           BLOOD CULTURE RECEIVED NO GROWTH TO DATE CULTURE WILL BE HELD FOR 5 DAYS BEFORE ISSUING A FINAL NEGATIVE REPORT [facility]    Report Status PENDING  Incomplete     Studies: Ct Angio Chest Pe W/cm &/or Wo Cm  12/06/2013   CLINICAL DATA:  Tachycardia and shortness of breath beginning last night.  EXAM: CT ANGIOGRAPHY CHEST WITH CONTRAST  TECHNIQUE: Multidetector CT imaging of the chest was performed using the standard protocol during bolus administration of intravenous contrast. Multiplanar CT image reconstructions and MIPs were obtained to evaluate the vascular anatomy.  CONTRAST:  80 mL  OMNIPAQUE IOHEXOL 350 MG/ML SOLN  COMPARISON:  Single view of the chest 11/16/2013 07/27/2007.  FINDINGS: No pulmonary embolus is identified. Trace bilateral pleural effusions are seen. There is no pericardial effusion. Cardiomegaly is noted. Calcific coronary artery disease is seen. There is no axillary, hilar or mediastinal lymphadenopathy. Small focus of airspace opacity is seen in the right middle lobe. There is also some dependent airspace disease bilaterally. The lungs are otherwise unremarkable.  Visualized upper abdomen shows a small amount of ascites. The liver is shrunken with a nodular border consistent with cirrhosis. Imaged intra-abdominal contents are otherwise unremarkable. No lytic or sclerotic bony lesion is seen.  Review of the MIP images confirms the above findings.  IMPRESSION: Negative for pulmonary embolus.  Small focus of airspace opacity in the right middle lobe could be secondary to pneumonia. Dependent bilateral airspace disease has an appearance most compatible with atelectasis.  Cardiomegaly and calcific coronary artery disease.  Trace bilateral pleural effusions.  Cirrhotic liver with associated small volume of upper abdominal ascites.   Electronically Signed   By: Drusilla Kannerhomas  Dalessio M.D.   On: 12/06/2013 19:37   [place] Abdomen Limited  12/06/2013   CLINICAL DATA:  Cirrhosis, hepatitis-C, thrombocytopenia, assess for ascites  EXAM: LIMITED ABDOMEN ULTRASOUND FOR ASCITES  TECHNIQUE: Limited ultrasound survey for ascites was performed in all four abdominal quadrants.  COMPARISON:  None.  FINDINGS: Small amount of abdominal ascites predominantly right upper quadrant and both lower quadrants.  IMPRESSION: Small amount of diffuse abdominal ascites.   Electronically Signed   By: Ruel Favorsrevor  Shick M.D.   On: 12/06/2013 19:14    Scheduled Meds: . amoxicillin-clavulanate  1 tablet Oral Q12H  . furosemide  40 mg Oral Daily  . lactulose  30 g Oral TID PC & HS  . multivitamin with minerals  1 tablet  Oral Daily  . nicotine  14 mg Transdermal Daily  . pantoprazole  40 mg Oral BID AC  . rifaximin  550 mg Oral BID  . sodium chloride  3 mL Intravenous Q12H  . sodium chloride  3 mL Intravenous Q12H  . spironolactone  25 mg Oral Daily  . sulfamethoxazole-trimethoprim  1 tablet Oral Q12H   Continuous Infusions:  Principal Problem:   Sepsis Active Problems:   Hepatic cirrhosis due to chronic hepatitis C infection   Hypertension   Anxiety   Tachycardia   Tobacco abuse   Thrombocytopenia   Coagulopathy   Portal hypertensive gastropathy   UTI (lower urinary tract infection)   Hyponatremia   Pneumonia, organism unspecified    Time spent: 5345    Illa LevelOsman, Hoang Reich [facility]-C  [facility] Pager [phone]. If 7PM-7AM, please contact night-coverage at [email or web address], password [email or web address] 12/08/2013, 1:43 PM  LOS: 2 days

## 2013-12-08 NOTE — Plan of Care (Signed)
Problem: Phase I Progression Outcomes Goal: Vital Signs stable- temperature less than 102 Outcome: Completed/Met Date Met:  12/08/13

## 2013-12-09 DIAGNOSIS — E871 Hypo-osmolality and hyponatremia: Secondary | ICD-10-CM

## 2013-12-09 LAB — CBC
HEMATOCRIT: 30.2 % — AB (ref 36.0–46.0)
Hemoglobin: 10.6 g/dL — ABNORMAL LOW (ref 12.0–15.0)
MCH: 35.7 pg — ABNORMAL HIGH (ref 26.0–34.0)
MCHC: 35.1 g/dL (ref 30.0–36.0)
MCV: 101.7 fL — ABNORMAL HIGH (ref 78.0–100.0)
PLATELETS: 52 10*3/uL — AB (ref 150–400)
RBC: 2.97 MIL/uL — ABNORMAL LOW (ref 3.87–5.11)
RDW: 16.9 % — ABNORMAL HIGH (ref 11.5–15.5)
WBC: 4.1 10*3/uL (ref 4.0–10.5)

## 2013-12-09 LAB — IRON AND TIBC
Iron: 101 ug/dL (ref 42–135)
UIBC: <15 ug/dL — ABNORMAL LOW (ref 125–400)

## 2013-12-09 LAB — BASIC METABOLIC PANEL
ANION GAP: 10 (ref 5–15)
BUN: 5 mg/dL — AB (ref 6–23)
CHLORIDE: 101 meq/L (ref 96–112)
CO2: 24 mEq/L (ref 19–32)
Calcium: 7.4 mg/dL — ABNORMAL LOW (ref 8.4–10.5)
Creatinine, Ser: 0.68 mg/dL (ref 0.50–1.10)
GFR calc Af Amer: >90 mL/min (ref >90)
GFR calc non Af Amer: >90 mL/min (ref >90)
Glucose, Bld: 87 mg/dL (ref 70–99)
Potassium: 3.4 mEq/L — ABNORMAL LOW (ref 3.7–5.3)
Sodium: 135 mEq/L — ABNORMAL LOW (ref 137–147)

## 2013-12-09 MED ORDER — AMOXICILLIN-POT CLAVULANATE 875-125 MG PO TABS: 1.0000 | ORAL_TABLET | Freq: Two times a day (BID) | ORAL | Status: AC

## 2013-12-09 MED ORDER — FUROSEMIDE 40 MG PO TABS: 40.0000 mg | ORAL_TABLET | Freq: Every day | ORAL | Status: AC

## 2013-12-09 MED ORDER — SPIRONOLACTONE 25 MG PO TABS: 25.0000 mg | ORAL_TABLET | Freq: Every day | ORAL | Status: AC

## 2013-12-09 MED ORDER — LABETALOL HCL 100 MG PO TABS
50.0000 mg | ORAL_TABLET | Freq: Every day | ORAL | Status: AC
Start: 2013-12-09 — End: ?

## 2013-12-09 NOTE — Discharge Summary (Signed)
Physician Discharge Summary  Cynthia Mathews RJJ:[id number] DOB: 17-Mar-1955 DOA: 12/06/2013  PCP: Glo Herring., MD  Admit date: 12/06/2013 Discharge date: 12/09/2013  Time spent: 65 minutes  Recommendations for Outpatient Follow-up:  1. Follow-up with Glo Herring., MD in 1 week. On follow-up patient will need a basic metabolic profile done to follow-up on electrolytes and renal function. Patient need a repeat EKG done to follow-up on QTC prolongation and decision as to whether to resume patient's Celexa at that time. 2. Follow up at the transplant center, to be placed back on the transplant list as patient has a elevated meld score and recent hospitalization for GI bleed.  Discharge Diagnoses:  Principal Problem:   Sepsis Active Problems:   Hepatic cirrhosis due to chronic hepatitis C infection   Hypertension   Anxiety   Tachycardia   Tobacco abuse   Thrombocytopenia   Coagulopathy   Portal hypertensive gastropathy   Bacteria in urine   Hyponatremia   Pneumonia, organism unspecified   CAP (community acquired pneumonia)   Discharge Condition: Stable and improved  Diet recommendation: Low sodium diet/heart healthy  Filed Weights   12/07/13 0541 12/08/13 0524 12/09/13 0515  Weight: 89.767 kg (197 lb 14.4 oz) 90 kg (198 lb 6.6 oz) 89.5 kg (197 lb 5 oz)    History of present illness:  Cynthia Mathews is a 58 y.o. female, with PMH of cirrhosis, hepatitis C, hypertension, anxiety, and tobacco abuse presented to the ED with complaints of abdominal pain- burning sensation,15 lb. wt gain since previous discharge on 11/18, and rapid heart rate. Noted some shortness of breath but is more concerned with abdominal pain and wt gain. Recent hospitalization for GI bleed. Hx of hepatic encephalopathy. Denied any alcohol use. Followed by GI- Dr. Patsy Baltimore at [facility]. Recently completed Harvoini and taken off of transplant list due to improvement in condition. Compliant with medications. Denied  any fever, vomiting, hemetemesis, hematochezia, melena, dysuria, hematuria  Hospital Course:  #1 sepsis Patient on presentation met criteria for sepsis with elevated heart rate, elevated respiratory rate and urinalysis consistent with a UTI. Patient was admitted to [facility] floor. Patient was placed empirically on IV Rocephin. Patient's diuretics were initially held and patient placed on IV fluids secondary to borderline hypotension. Patient's labetalol was also resumed at half home dose. Patient was pancultured. CT of the chest was also done which did show a small focus of airspace opacity in the right middle lobe could be secondary to pneumonia. Also showed a dependent bilateral airspace disease likely atelectasis. Abdominal ultrasound which was done showed minimal abdominal ascites. Patient was followed. Patient improved clinically. Azithromycin was initially added to Rocephin secondary to pneumonia noted on CT chest. Due to prolonged QTC on EKG patient was transitioned to oral Augmentin to cover for probable community-acquired pneumonia and Bactrim for possible urinary tract infection. Patient remained afebrile tachycardia resolved tachycardia. Resolved. Urine cultures came out with 90,000 off different bacterial morphotypes felt to be likely a bacteriuria. Patient had received at least 3 days of antibiotics. Patient will be discharged home on 4 more days of oral Augmentin to complete a one-week course of antibiotic therapy. Patient will need a repeat EKG done on follow-up with PCP to follow-up on QTC prolongation.  #2 bacteriuria On admission patient had presented with criteria consistent with sepsis. Initially felt patient had a urinary tract infection and patient was started empirically on IV Rocephin. Urine cultures were obtained which grew 90,000 colonies of multiple bacterial morphotypes. Patient had received at least  3-4 days of antibiotic therapy. Patient was initially placed on oral Bactrim  however due to urine culture results this has been discontinued. Outpatient follow-up. 62F   #3 probable community-acquired pneumonia Patient had presented with some shortness of breath. CT of the chest which was done was negative for PE however was concerning for small focus of opacity worrisome for pneumonia in the right middle lobe. Patient was initially placed on IV Rocephin azithromycin however secondary to QTC prolongation patient was transitioned to oral Augmentin. Patient be discharged on 4 more days of oral Augmentin to complete a one-week course of antibiotic therapy. Patient will follow-up with PCP as outpatient.  #4 hepatic cirrhosis due to chronic hepatitis C infection Patient had presented with concerns of abdominal pain and recent weight gain concerns for ascites. Patient had denied alcohol use. Abdominal ultrasound was done that showed minimal ascites. Patient was placed empirically on IV Rocephin and follow. Patient was noted to have a recent decompensation with recent GI bleed during her prior hospitalization. Patient had completed harvoni therapy was taken off the transplant list by GI at [facility]. Patient was noted to have a meld score of 24 indicating estimated 19.6% 3 month mortality. Dr. Cruzita Lederer who is the admitting physician spoke with GI/transplant team at [facility] and patient will be placed back on the transplant list. Patient only to follow-up with him as outpatient. Patient was resumed back on half home dose of spironolactone and Lasix as well as beta blocker. Patient was also maintained on her lactulose and rifaximin. Patient remained in stable condition will follow-up as outpatient.   #5 tachycardia It was felt patient's tachycardia may have been secondary to sepsis as well as secondary to history of beta-blockade. Patient had not taken her labetalol on day of admission and this was resumed at half home dose. Patient's tachycardia had resolved by day of discharge. See problem #1.  #6  chronic thrombocytopenia Remained stable throughout the hospitalization. Outpatient follow-up.  #7 Coagulopathy  Likely secondary to problem #4. INR was 2.42 on 12/07/2013. Outpatient follow-up.  #8 hyponatremia/hypokalemia Secondary to hypovolemia. Improved with resumption of patient's diuretics. Patient's potassium was repleted. Outpatient follow-up.  #9 hypertension Patient blood pressure was borderline throughout the hospitalization. Patient's Norvasc was discontinued. Patient was resumed at half home dose of labetalol as well as half home dose of Lasix and Aldactone. Outpatient follow-up.  #10 anxiety Patient was maintained on anxiolytics. Patient's Celexa was discontinued secondary to QTC prolongation. Patient will need repeat EKG done on follow-up with PCP to decide as to when to resume patient Celexa.   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 Antibiotics had to be adjusted secondary to QTC prolongation. Patient's Celexa was also discontinued. Patient is to follow-up with PCP as outpatient and repeat EKG will be obtained at that time and decision as to whether to resume patient's Celexa will be made.   Procedures:  Abdominal ultrasound 12/06/2013  CT chest 12/06/2013  Consultations:  None  Discharge Exam: Filed Vitals:   12/09/13 0515  BP: 101/40  Pulse: 98  Temp: 98 F (36.7 C)  Resp: 16    General: NAD Cardiovascular: RRR Respiratory: CTAB  Discharge Instructions You were cared for by a hospitalist during your hospital stay. If you have any questions about your discharge medications or the care you received while you were in the hospital after you are discharged, you can call the unit and asked to speak with the hospitalist on call  if the hospitalist that took care of you is not available. Once you are discharged, your primary care physician will handle any further medical issues. Please note that NO REFILLS for  any discharge medications will be authorized once you are discharged, as it is imperative that you return to your primary care physician (or establish a relationship with a primary care physician if you do not have one) for your aftercare needs so that they can reassess your need for medications and monitor your lab values.  Discharge Instructions    Diet - low sodium heart healthy    Complete by:  As directed      Discharge instructions    Complete by:  As directed   Follow up with Glo Herring., MD in 1 week. Follow up with transplant center.     Increase activity slowly    Complete by:  As directed           Current Discharge Medication List    START taking these medications   Details  amoxicillin-clavulanate (AUGMENTIN) 875-125 MG per tablet Take 1 tablet by mouth every 12 (twelve) hours. Take for 4 days then stop. Qty: 8 tablet, Refills: 0      CONTINUE these medications which have CHANGED   Details  furosemide (LASIX) 40 MG tablet Take 1 tablet (40 mg total) by mouth daily. Qty: 30 tablet, Refills: 0    labetalol (NORMODYNE) 100 MG tablet Take 0.5 tablets (50 mg total) by mouth daily. Qty: 30 tablet, Refills: 0    spironolactone (ALDACTONE) 25 MG tablet Take 1 tablet (25 mg total) by mouth daily. Qty: 30 tablet, Refills: 0      CONTINUE these medications which have NOT CHANGED   Details  ALPRAZolam (XANAX) 0.5 MG tablet Take 0.5 mg by mouth daily as needed. FOR ANXIETY    dicyclomine (BENTYL) 20 MG tablet Take 20 mg by mouth daily as needed for spasms.     lactulose (CHRONULAC) 10 GM/15ML solution Take 45 mLs (30 g total) by mouth 4 (four) times daily. After meals and bed time for atleast 2-3 BM's/day Qty: 240 mL, Refills: 3    Multiple Vitamin (MULTIVITAMIN WITH MINERALS) TABS Take 1 tablet by mouth daily.    rifaximin (XIFAXAN) 550 MG TABS Take 550 mg by mouth 2 (two) times daily.    Thiamine HCl (VITAMIN B-1 PO) Take 1 tablet by mouth daily.     HYDROcodone-acetaminophen (NORCO) 5-325 MG per tablet Take 1-2 tabs by mouth every 6 hours when necessary pain. Qty: 20 tablet, Refills: 0    Ledipasvir-Sofosbuvir (HARVONI) 90-400 MG TABS Take 1 tablet by mouth daily.    LORazepam (ATIVAN) 0.5 MG tablet Take 0.5 mg by mouth every 8 (eight) hours as needed. For anxiety.    nicotine (NICODERM CQ - DOSED IN MG/24 HOURS) 14 mg/24hr patch Place 1 patch (14 mg total) onto the skin daily. Qty: 28 patch, Refills: 1    ondansetron (ZOFRAN ODT) 4 MG disintegrating tablet Take 1 tablet (4 mg total) by mouth every 8 (eight) hours as needed for nausea or vomiting. Qty: 30 tablet, Refills: 2    pantoprazole (PROTONIX) 40 MG tablet Take 1 tablet (40 mg total) by mouth 2 (two) times daily before a meal. Qty: 60 tablet, Refills: 5      STOP taking these medications     amLODipine (NORVASC) 10 MG tablet      citalopram (CELEXA) 20 MG tablet      ciprofloxacin (CIPRO) 500 MG  tablet        Allergies  Allergen Reactions  . Codeine Nausea Only   Follow-up Information    Follow up with Glo Herring., MD. Schedule an appointment as soon as possible for a visit in 1 week.   Specialty:  Internal Medicine   Contact information:   [address] [place] [address] [phone]        The results of significant diagnostics from this hospitalization (including imaging, microbiology, ancillary and laboratory) are listed below for reference.    Significant Diagnostic Studies: Ct Angio Chest Pe W/cm &/or Wo Cm  12/06/2013   CLINICAL DATA:  Tachycardia and shortness of breath beginning last night.  EXAM: CT ANGIOGRAPHY CHEST WITH CONTRAST  TECHNIQUE: Multidetector CT imaging of the chest was performed using the standard protocol during bolus administration of intravenous contrast. Multiplanar CT image reconstructions and MIPs were obtained to evaluate the vascular anatomy.  CONTRAST:  80 mL OMNIPAQUE IOHEXOL 350 MG/ML SOLN  COMPARISON:   Single view of the chest 11/16/2013 07/27/2007.  FINDINGS: No pulmonary embolus is identified. Trace bilateral pleural effusions are seen. There is no pericardial effusion. Cardiomegaly is noted. Calcific coronary artery disease is seen. There is no axillary, hilar or mediastinal lymphadenopathy. Small focus of airspace opacity is seen in the right middle lobe. There is also some dependent airspace disease bilaterally. The lungs are otherwise unremarkable.  Visualized upper abdomen shows a small amount of ascites. The liver is shrunken with a nodular border consistent with cirrhosis. Imaged intra-abdominal contents are otherwise unremarkable. No lytic or sclerotic bony lesion is seen.  Review of the MIP images confirms the above findings.  IMPRESSION: Negative for pulmonary embolus.  Small focus of airspace opacity in the right middle lobe could be secondary to pneumonia. Dependent bilateral airspace disease has an appearance most compatible with atelectasis.  Cardiomegaly and calcific coronary artery disease.  Trace bilateral pleural effusions.  Cirrhotic liver with associated small volume of upper abdominal ascites.   Electronically Signed   By: Inge Rise M.D.   On: 12/06/2013 19:37   US Abdomen Limited  12/06/2013   CLINICAL DATA:  Cirrhosis, hepatitis-C, thrombocytopenia, assess for ascites  EXAM: LIMITED ABDOMEN ULTRASOUND FOR ASCITES  TECHNIQUE: Limited ultrasound survey for ascites was performed in all four abdominal quadrants.  COMPARISON:  None.  FINDINGS: Small amount of abdominal ascites predominantly right upper quadrant and both lower quadrants.  IMPRESSION: Small amount of diffuse abdominal ascites.   Electronically Signed   By: Daryll Brod M.D.   On: 12/06/2013 19:14   Dg Chest Port 1 View  11/16/2013   CLINICAL DATA:  Vomiting blood. Shortness of breath today. History of cirrhosis.  EXAM: PORTABLE CHEST - 1 VIEW  COMPARISON:  06/04/2010  FINDINGS: Heart and mediastinal contours are  within normal limits. Scarring at the left lung base. Mild peribronchial thickening and prominent interstitial markings, similar prior study. No confluent opacities or effusions. No acute bony abnormality.  IMPRESSION: Mild peribronchial thickening and interstitial prominence, similar to prior study. This may reflect mild chronic bronchitis.  Lingular scarring.   Electronically Signed   By: Rolm Baptise M.D.   On: 11/16/2013 19:22    Microbiology: Recent Results (from the past 240 hour(s))  Culture, Urine     Status: None   Collection Time: 12/06/13 11:29 AM  Result Value Ref Range Status   Specimen Description URINE, CLEAN CATCH  Final   Special Requests NONE  Final   Culture  Setup Time  Final    12/07/2013 12:00 Performed at [facility]   Final    90,000 COLONIES/ML Performed at [facility]    Culture   Final    Multiple bacterial morphotypes present, none predominant. Suggest appropriate recollection if clinically indicated. Performed at [facility]    Report Status 12/08/2013 FINAL  Final  Culture, blood (routine x 2)     Status: None (Preliminary result)   Collection Time: 12/06/13  6:11 PM  Result Value Ref Range Status   Specimen Description BLOOD LEFT ARM  Final   Special Requests BOTTLES DRAWN AEROBIC AND ANAEROBIC 5CC  Final   Culture  Setup Time   Final    12/07/2013 01:07 Performed at [facility]    Culture   Final           BLOOD CULTURE RECEIVED NO GROWTH TO DATE CULTURE WILL BE HELD FOR 5 DAYS BEFORE ISSUING A FINAL NEGATIVE REPORT Performed at [facility]    Report Status PENDING  Incomplete  Culture, blood (routine x 2)     Status: None (Preliminary result)   Collection Time: 12/06/13  6:17 PM  Result Value Ref Range Status   Specimen Description BLOOD RIGHT ARM  Final   Special Requests BOTTLES DRAWN AEROBIC AND ANAEROBIC 3CC  Final   Culture  Setup Time   Final    12/07/2013 01:06 Performed  at [facility]    Culture   Final           BLOOD CULTURE RECEIVED NO GROWTH TO DATE CULTURE WILL BE HELD FOR 5 DAYS BEFORE ISSUING A FINAL NEGATIVE REPORT Performed at [facility]    Report Status PENDING  Incomplete     Labs: Basic Metabolic Panel:  Recent Labs Lab 12/06/13 1300 12/07/13 0607 12/08/13 0610 12/09/13 0622  NA 128* 134* 135* 135*  K 3.4* 3.6* 3.5* 3.4*  CL 92* 100 100 101  CO2 25 25 26 24   GLUCOSE 129* 96 103* 87  BUN 3* 4* 4* 5*  CREATININE 0.55 0.57 0.69 0.68  CALCIUM 7.7* 7.0* 7.5* 7.4*  MG  --  1.6  --   --   PHOS  --  2.4  --   --    Liver Function Tests:  Recent Labs Lab 12/06/13 1300 12/07/13 0607 12/08/13 0610  AST 135* 101* 103*  ALT 39* 31 30  ALKPHOS 93 75 95  BILITOT 8.3* 6.8* 5.6*  PROT 7.3 6.0 6.2  ALBUMIN 1.8* 1.6* 1.6*    Recent Labs Lab 12/06/13 1300  LIPASE 70*   No results for input(s): AMMONIA in the last 168 hours. CBC:  Recent Labs Lab 12/06/13 1300 12/07/13 0607 12/08/13 0610 12/09/13 0622  WBC 7.6 5.2 4.3 4.1  NEUTROABS 5.4  --   --   --   HGB 12.4 10.7* 10.6* 10.6*  HCT 35.0* 30.6* 30.3* 30.2*  MCV 102.6* 100.3* 101.3* 101.7*  PLT 65* 61* 52* 52*   Cardiac Enzymes: No results for input(s): CKTOTAL, CKMB, CKMBINDEX, TROPONINI in the last 168 hours. BNP: BNP (last 3 results) No results for input(s): PROBNP in the last 8760 hours. CBG: No results for input(s): GLUCAP in the last 168 hours.     SignedIrine Seal MD [facility] 12/09/2013, 11:25 AM

## 2013-12-09 NOTE — Progress Notes (Signed)
NCM spoke with patient, assisted her with Match Letter for her medications.  Patient wants to continue going to her PCP that she has.  She states she will be getting insurance soon.  Informed her she could only use the Match Program once a year.  She states she understands.

## 2013-12-09 NOTE — Progress Notes (Signed)
Catalina GravelKathy S Hungate to be D/C'd Home per MD order.  Discussed with the patient and all questions fully answered.    Medication List    STOP taking these medications        amLODipine 10 MG tablet  Commonly known as:  NORVASC     ciprofloxacin 500 MG tablet  Commonly known as:  CIPRO     citalopram 20 MG tablet  Commonly known as:  CELEXA      TAKE these medications        ALPRAZolam 0.5 MG tablet  Commonly known as:  XANAX  Take 0.5 mg by mouth daily as needed. FOR ANXIETY     amoxicillin-clavulanate 875-125 MG per tablet  Commonly known as:  AUGMENTIN  Take 1 tablet by mouth every 12 (twelve) hours. Take for 4 days then stop.     dicyclomine 20 MG tablet  Commonly known as:  BENTYL  Take 20 mg by mouth daily as needed for spasms.     furosemide 40 MG tablet  Commonly known as:  LASIX  Take 1 tablet (40 mg total) by mouth daily.     HARVONI 90-400 MG Tabs  Generic drug:  Ledipasvir-Sofosbuvir  Take 1 tablet by mouth daily.     HYDROcodone-acetaminophen 5-325 MG per tablet  Commonly known as:  NORCO/VICODIN  Take 1-2 tabs by mouth every 6 hours when necessary pain.     labetalol 100 MG tablet  Commonly known as:  NORMODYNE  Take 0.5 tablets (50 mg total) by mouth daily.     lactulose 10 GM/15ML solution  Commonly known as:  CHRONULAC  Take 45 mLs (30 g total) by mouth 4 (four) times daily. After meals and bed time for atleast 2-3 BM's/day     LORazepam 0.5 MG tablet  Commonly known as:  ATIVAN  Take 0.5 mg by mouth every 8 (eight) hours as needed. For anxiety.     multivitamin with minerals Tabs tablet  Take 1 tablet by mouth daily.     nicotine 14 mg/24hr patch  Commonly known as:  NICODERM CQ - dosed in mg/24 hours  Place 1 patch (14 mg total) onto the skin daily.     ondansetron 4 MG disintegrating tablet  Commonly known as:  ZOFRAN ODT  Take 1 tablet (4 mg total) by mouth every 8 (eight) hours as needed for nausea or vomiting.     pantoprazole 40 MG  tablet  Commonly known as:  PROTONIX  Take 1 tablet (40 mg total) by mouth 2 (two) times daily before a meal.     rifaximin 550 MG Tabs tablet  Commonly known as:  XIFAXAN  Take 550 mg by mouth 2 (two) times daily.     spironolactone 25 MG tablet  Commonly known as:  ALDACTONE  Take 1 tablet (25 mg total) by mouth daily.     VITAMIN B-1 PO  Take 1 tablet by mouth daily.        VVS, Skin clean, dry and intact without evidence of skin break down, no evidence of skin tears noted. IV catheter discontinued intact. Site without signs and symptoms of complications. Dressing and pressure applied.  An After Visit Summary was printed and given to the patient.  D/c education completed with patient/family including follow up instructions, medication list, d/c activities limitations if indicated, with other d/c instructions as indicated by MD - patient able to verbalize understanding, all questions fully answered.   Patient instructed to return to ED, call  911, or call MD for any changes in condition.   Patient escorted via WC, and D/C home via private auto.  Aldean AstLEsperance, Jakorian Marengo C 12/09/2013 1:31 PM

## 2013-12-13 LAB — CULTURE, BLOOD (ROUTINE X 2)
CULTURE: NO GROWTH
Culture: NO GROWTH

## 2014-06-07 DEATH — deceased

## 2015-11-17 IMAGING — CR DG CHEST 1V PORT
1 series · 1 of 1 positions shown · non-contrast
Comparison: 06/04/2010

CLINICAL DATA: Vomiting blood. Shortness of breath today. History
of cirrhosis.

EXAM:
PORTABLE CHEST - 1 VIEW

[ap]
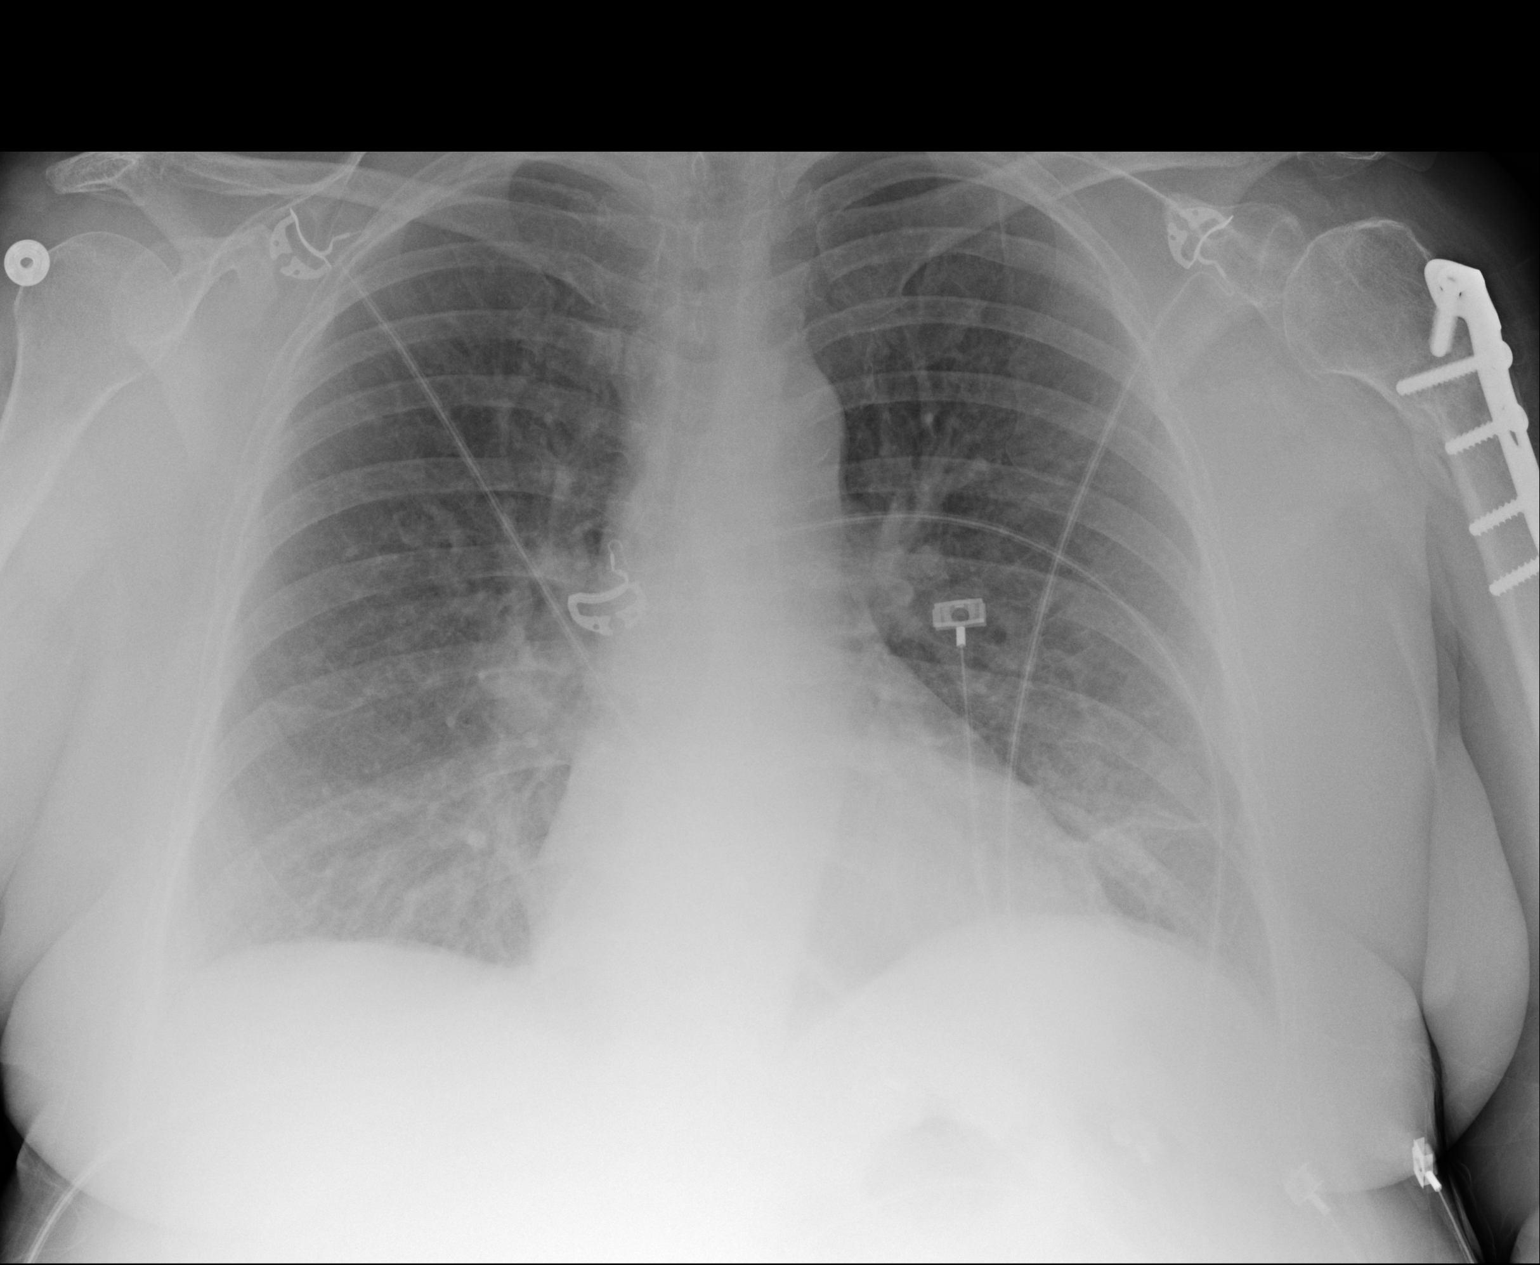

[1 of 1 positions shown; findings below may reference images not displayed]

FINDINGS: Heart and mediastinal contours are within normal limits. Scarring at
the left lung base. Mild peribronchial thickening and prominent
interstitial markings, similar prior study. No confluent opacities
or effusions. No acute bony abnormality.
IMPRESSION: Mild peribronchial thickening and interstitial prominence, similar
to prior study. This may reflect mild chronic bronchitis.

Lingular scarring.
